# Patient Record
Sex: Female | Born: 1980 | Race: White | Hispanic: No | Marital: Single | Smoking: Never smoker
Health system: Southern US, Community
[De-identification: ages and names within clinical notes are randomized; demographics above are authoritative.]

## PROBLEM LIST (undated history)

## (undated) DIAGNOSIS — I10 Essential (primary) hypertension: Secondary | ICD-10-CM

## (undated) DIAGNOSIS — F419 Anxiety disorder, unspecified: Secondary | ICD-10-CM

## (undated) DIAGNOSIS — K219 Gastro-esophageal reflux disease without esophagitis: Secondary | ICD-10-CM

## (undated) DIAGNOSIS — G43909 Migraine, unspecified, not intractable, without status migrainosus: Secondary | ICD-10-CM

## (undated) DIAGNOSIS — IMO0001 Reserved for inherently not codable concepts without codable children: Secondary | ICD-10-CM

## (undated) DIAGNOSIS — E041 Nontoxic single thyroid nodule: Secondary | ICD-10-CM

## (undated) DIAGNOSIS — Z9109 Other allergy status, other than to drugs and biological substances: Secondary | ICD-10-CM

## (undated) DIAGNOSIS — I499 Cardiac arrhythmia, unspecified: Secondary | ICD-10-CM

## (undated) HISTORY — PX: BIOPSY THYROID: PRO38

## (undated) HISTORY — DX: Essential (primary) hypertension: I10

## (undated) HISTORY — PX: COLPOSCOPY: SHX161

## (undated) HISTORY — DX: Anxiety disorder, unspecified: F41.9

## (undated) HISTORY — DX: Other allergy status, other than to drugs and biological substances: Z91.09

## (undated) HISTORY — PX: WISDOM TOOTH EXTRACTION: SHX21

## (undated) HISTORY — DX: Migraine, unspecified, not intractable, without status migrainosus: G43.909

---

## 2014-06-05 DIAGNOSIS — R5383 Other fatigue: Secondary | ICD-10-CM | POA: Insufficient documentation

## 2014-06-05 DIAGNOSIS — R413 Other amnesia: Secondary | ICD-10-CM | POA: Insufficient documentation

## 2014-06-05 DIAGNOSIS — H539 Unspecified visual disturbance: Secondary | ICD-10-CM | POA: Insufficient documentation

## 2014-07-17 DIAGNOSIS — G4700 Insomnia, unspecified: Secondary | ICD-10-CM | POA: Insufficient documentation

## 2015-06-04 DIAGNOSIS — F411 Generalized anxiety disorder: Secondary | ICD-10-CM | POA: Insufficient documentation

## 2015-06-04 DIAGNOSIS — F4001 Agoraphobia with panic disorder: Secondary | ICD-10-CM | POA: Insufficient documentation

## 2016-02-15 ENCOUNTER — Encounter: Payer: Self-pay | Admitting: Obstetrics and Gynecology

## 2016-02-15 ENCOUNTER — Ambulatory Visit (INDEPENDENT_AMBULATORY_CARE_PROVIDER_SITE_OTHER): Payer: BLUE CROSS/BLUE SHIELD | Admitting: Obstetrics and Gynecology

## 2016-02-15 VITALS — BP 118/70 | HR 72 | Resp 14 | Ht 63.25 in | Wt 175.0 lb

## 2016-02-15 DIAGNOSIS — G43909 Migraine, unspecified, not intractable, without status migrainosus: Secondary | ICD-10-CM | POA: Insufficient documentation

## 2016-02-15 DIAGNOSIS — Z124 Encounter for screening for malignant neoplasm of cervix: Secondary | ICD-10-CM

## 2016-02-15 DIAGNOSIS — I10 Essential (primary) hypertension: Secondary | ICD-10-CM | POA: Diagnosis not present

## 2016-02-15 DIAGNOSIS — Z01419 Encounter for gynecological examination (general) (routine) without abnormal findings: Secondary | ICD-10-CM | POA: Diagnosis not present

## 2016-02-15 DIAGNOSIS — G43109 Migraine with aura, not intractable, without status migrainosus: Secondary | ICD-10-CM

## 2016-02-15 DIAGNOSIS — E049 Nontoxic goiter, unspecified: Secondary | ICD-10-CM

## 2016-02-15 MED ORDER — NORETHINDRONE 0.35 MG PO TABS: 1.0000 | ORAL_TABLET | Freq: Every day | ORAL | Status: DC

## 2016-02-15 NOTE — Patient Instructions (Signed)

## 2016-02-15 NOTE — Progress Notes (Addendum)
Patient ID: Hannah Hodge, female   DOB: [birth year], 35 y.o.   MRN: [id number] 35 y.o. [id number] SingleCaucasianF here for annual exam. She would like to discuss birth control. Not currently in a relationship. Period Cycle (Days): 84 Period Duration (Days): 3-4 days  Period Pattern: Regular Menstrual Flow: Moderate Menstrual Control: Maxi pad Dysmenorrhea: (!) Moderate Dysmenorrhea Symptoms: Cramping, Headache  She saturates a pad in 6-8 hours. She takes ibuprofen for her cramps. She has migraines with occasional aura's, start a week prior to her cycle and last for 2-2.5 weeks. She has seen a Neurologist who told her to go on the 3 month pills.   Patient's last menstrual period was 11/23/2015.          Sexually active: No.  The current method of family planning is OCP (estrogen/progesterone).    Exercising: No.  The patient does not participate in regular exercise at present. Smoker:  no  Health Maintenance: Pap:  2015 WNL per patient  History of abnormal Pap:  Yes colposcopy, in the last few years, no cervical surgery.  MMG:  N/A Colonoscopy:  N/A BMD:   N/A TDaP:  Unsure, she will check Gardasil: N/A   reports that she has never smoked. She has never used smokeless tobacco. She reports that she does not drink alcohol or use illicit drugs.She has full custody of her 35 year old daughter. Has family around, lives in [place]. She works in a TEFL teacher.   Past Medical History  Diagnosis Date  . Anxiety   . Hypertension   . Migraine     Past Surgical History  Procedure Laterality Date  . Cesarean section    . Colposcopy      Current Outpatient Prescriptions  Medication Sig Dispense Refill  . buPROPion (WELLBUTRIN XL) 150 MG 24 hr tablet   0  . busPIRone (BUSPAR) 15 MG tablet   0  . CAMRESE 0.15-0.03 &0.01 MG tablet   3  . cholecalciferol (VITAMIN D) 1000 units tablet Take 1,000 Units by mouth daily.    . metoprolol succinate (TOPROL-XL) 50 MG 24 hr tablet Take 50 mg by  mouth.    . montelukast (SINGULAIR) 10 MG tablet   2  . Multiple Vitamin (MULTIVITAMIN) tablet Take 1 tablet by mouth daily.    . rizatriptan (MAXALT) 10 MG tablet Take 10 mg by mouth.    . traZODone (DESYREL) 100 MG tablet      No current facility-administered medications for this visit.    Family History  Problem Relation Age of Onset  . Hypertension Mother   . Atrial fibrillation Mother   . Migraines Mother   . Migraines Sister   . Migraines Brother   . Dementia Maternal Grandmother   . Leukemia Maternal Grandfather   . Migraines Maternal Grandfather   MGM with CVA  Review of Systems  Constitutional: Negative.   Eyes: Negative.   Respiratory: Negative.   Cardiovascular: Negative.   Gastrointestinal: Negative.   Endocrine: Negative.   Genitourinary: Negative.   Musculoskeletal: Negative.   Skin: Negative.   Allergic/Immunologic: Negative.   Neurological: Positive for headaches.  Psychiatric/Behavioral: Negative.     Exam:   BP 118/70 mmHg  Pulse 72  Resp 14  Ht 5' 3.25" (1.607 m)  Wt 175 lb (79.379 kg)  BMI 30.74 kg/m2  LMP 11/23/2015  Weight change: @ Height:   Height: 5' 3.25" (160.7 cm)  Ht Readings from Last 3 Encounters:  02/15/16 5' 3.25" (1.607 m)    General  appearance: alert, cooperative and appears stated age Head: Normocephalic, without obvious abnormality, atraumatic Neck: no adenopathy, supple, symmetrical, trachea midline and thyroid with suspected nodule on the right side, <1 cm Lungs: clear to auscultation bilaterally Breasts: normal appearance, no masses or tenderness Heart: regular rate and rhythm Abdomen: soft, non-tender; bowel sounds normal; no masses,  no organomegaly Extremities: extremities normal, atraumatic, no cyanosis or edema Skin: Skin color, texture, turgor normal. No rashes or lesions Lymph nodes: Cervical, supraclavicular, and axillary nodes normal. No abnormal inguinal nodes palpated Neurologic: Grossly  normal   Pelvic: External genitalia:  no lesions              Urethra:  normal appearing urethra with no masses, tenderness or lesions              Bartholins and Skenes: normal                 Vagina: normal appearing vagina with normal color and discharge, no lesions              Cervix: no lesions               Bimanual Exam:  Uterus:  normal size, contour, position, consistency, mobility, non-tender, anteverted              Adnexa: no mass, fullness, tenderness               Rectovaginal: Confirms               Anus:  normal sphincter tone, no lesions  Chaperone was present for exam.  A:  Well Woman with normal exam  Migraines with aura  HTN  Suspected right thyroid nodule    P:   Pap with hpv  Labs and immunizations with primary MD  Recommend she go off OCP's, HTN is a relative contraindication, migraines with aura are an absolute contraindication to OCP's  Discussed options of the mini-pill, nexplanon and the mirena IUD  Will try the mini-pill   Calendar menses, track headaches  Discussed breast self exam  Discussed calcium and vit D  CC: Dr Anne Hahn, Neurology   Addendum: will set up a thyroid ultrasound

## 2016-02-18 NOTE — Addendum Note (Signed)
Addended by: Tobi Bastos on: 02/18/2016 03:43 PM   Modules accepted: Orders

## 2016-02-19 ENCOUNTER — Ambulatory Visit
Admission: RE | Admit: 2016-02-19 | Discharge: 2016-02-19 | Disposition: A | Discharge status: Home / Self Care | Payer: BLUE CROSS/BLUE SHIELD | Source: Ambulatory Visit | Attending: Obstetrics and Gynecology | Admitting: Obstetrics and Gynecology

## 2016-02-19 DIAGNOSIS — E049 Nontoxic goiter, unspecified: Secondary | ICD-10-CM

## 2016-02-22 ENCOUNTER — Telehealth: Payer: Self-pay | Admitting: Obstetrics and Gynecology

## 2016-02-22 DIAGNOSIS — E041 Nontoxic single thyroid nodule: Secondary | ICD-10-CM

## 2016-02-22 LAB — IPS PAP TEST WITH HPV

## 2016-02-22 NOTE — Telephone Encounter (Signed)
Spoke with patient. Advised I have reviewed her results from her thyroid ultrasound on 02/19/2016 with Dr.Jertson. Advised there is a 2.1 cm nodule within the right lobe of the thyroid which will need further evaluation and biopsy. She is agreeable and verbalizes understanding. Advised I will place a referral over to [facility]er [facility]. She is agreeable and requests an appointment Monday or Tuesday afternoons. Advised I will contact Dr.Althiemer's office to schedule and return call. She is agreeable.  Call to Dr.Althiemer's office. Per receptionist I will need to send over the referral and fax the patient ultrasound, demographics, and OV notes be faxed to [phone] attention Westly PamLacy for review with Dr.Althiemer for scheduling. Records faxed with cover sheet and confirmation to [phone].  Spoke with patient. Advised I have sent the referral over the Dr.Althiemer's office and she will be contacted directly by his office to schedule. She is agreeable and verbalizes understanding.  Dr.Jertson, Do you agree with the above recommendations?

## 2016-02-22 NOTE — Telephone Encounter (Signed)
Patient was seen for thyroid ultrasound with [facility] on 02/19/2016. Results are available in EPIC. Results also to Dr.Jertson's desk for review and advise.

## 2016-02-22 NOTE — Telephone Encounter (Signed)
Patient is calling to see if her thyroid ultrsasound results are in yet.

## 2016-02-22 NOTE — Telephone Encounter (Signed)
Yes, thank you.

## 2016-02-25 ENCOUNTER — Telehealth: Payer: Self-pay | Admitting: Emergency Medicine

## 2016-02-25 DIAGNOSIS — R87610 Atypical squamous cells of undetermined significance on cytologic smear of cervix (ASC-US): Secondary | ICD-10-CM

## 2016-02-25 DIAGNOSIS — R87810 Cervical high risk human papillomavirus (HPV) DNA test positive: Principal | ICD-10-CM

## 2016-02-25 NOTE — Telephone Encounter (Signed)
Message left to return call to Nickelsvilleracy at [phone].   Atypical squamous cells of undetermined significance (ASC-US). 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  Patient was to start on Micronor.

## 2016-02-25 NOTE — Telephone Encounter (Signed)
-----   Message from Romualdo BolkJill Evelyn Jertson, MD sent at 02/23/2016  2:35 PM EST ----- Please inform the patient that her pap is ASCUS with +HPV, she needs a colposcopy.

## 2016-02-25 NOTE — Telephone Encounter (Signed)
Patient notified of message from Dr Oscar LaJertson.  She is agreeable to scheduling colposcopy. Scheduled for 03/01/16 at 1300.  Brief description of procedure given to patient.  Colposcopy pre-procedure instructions given. Discussed menses and need to not have any bleeding on day of appointment, advised to call to reschedule if starts cycle. Patient on [place]. Make sure to eat a meal and hydrate before appointment.  Advised 800 mg of Motrin PO with food one hour prior to appointment.   Patient verbalized understanding of preprocedure instructions and  will call to reschedule if will be on menses or has any concerns regarding pregnancy.  Patient is advised she will be contacted with insurance coverage information. cc Harland DingwallSuzy Dixon  Routing to provider for final review. Patient agreeable to disposition. Will close encounter.

## 2016-03-01 ENCOUNTER — Encounter: Payer: Self-pay | Admitting: Obstetrics and Gynecology

## 2016-03-01 ENCOUNTER — Ambulatory Visit (INDEPENDENT_AMBULATORY_CARE_PROVIDER_SITE_OTHER): Payer: BLUE CROSS/BLUE SHIELD | Admitting: Obstetrics and Gynecology

## 2016-03-01 VITALS — BP 108/60 | HR 80 | Resp 14 | Wt 178.0 lb

## 2016-03-01 DIAGNOSIS — R87610 Atypical squamous cells of undetermined significance on cytologic smear of cervix (ASC-US): Secondary | ICD-10-CM

## 2016-03-01 DIAGNOSIS — R87810 Cervical high risk human papillomavirus (HPV) DNA test positive: Secondary | ICD-10-CM | POA: Diagnosis not present

## 2016-03-01 DIAGNOSIS — Z01812 Encounter for preprocedural laboratory examination: Secondary | ICD-10-CM

## 2016-03-01 NOTE — Progress Notes (Signed)
Patient ID: Hannah BillsCatherine Kotowski, female   DOB: [birth year], 35 y.o.   MRN: [id number] GYNECOLOGY  VISIT   HPI: 35 y.o.   Single  Caucasian  female   [phone] with Patient's last menstrual period was 11/23/2015.   here for  A colposcopy for an ASCUS/+HPV pap. She has had prior abnormal paps in the past, denies any h/o prior cervical surgery.  The patient recently diagnosed with a thyroid nodule. She has an appointment with an endocrinologist coming up. Not currently sexually active.   GYNECOLOGIC HISTORY: Patient's last menstrual period was 11/23/2015. Contraception:OCP Menopausal hormone therapy: none         OB History    Gravida Para Term Preterm AB TAB SAB Ectopic Multiple Living   3 1 1  2  2   1          Patient Active Problem List   Diagnosis Date Noted  . Migraine with aura and without status migrainosus, not intractable 02/15/2016  . Essential hypertension 02/15/2016    Past Medical History  Diagnosis Date  . Anxiety   . Hypertension   . Migraine     Past Surgical History  Procedure Laterality Date  . Cesarean section    . Colposcopy      Current Outpatient Prescriptions  Medication Sig Dispense Refill  . buPROPion (WELLBUTRIN XL) 150 MG 24 hr tablet   0  . busPIRone (BUSPAR) 15 MG tablet   0  . cholecalciferol (VITAMIN D) 1000 units tablet Take 1,000 Units by mouth daily.    . metoprolol succinate (TOPROL-XL) 50 MG 24 hr tablet Take 50 mg by mouth.    . montelukast (SINGULAIR) 10 MG tablet   2  . Multiple Vitamin (MULTIVITAMIN) tablet Take 1 tablet by mouth daily.    . norethindrone (MICRONOR,CAMILA,ERRIN) 0.35 MG tablet Take 1 tablet (0.35 mg total) by mouth daily. 3 Package 0  . rizatriptan (MAXALT) 10 MG tablet Take 10 mg by mouth.    . traZODone (DESYREL) 100 MG tablet      No current facility-administered medications for this visit.     ALLERGIES: Review of patient's allergies indicates no known allergies.  Family History  Problem Relation Age of Onset   . Hypertension Mother   . Atrial fibrillation Mother   . Migraines Mother   . Migraines Sister   . Migraines Brother   . Dementia Maternal Grandmother   . Leukemia Maternal Grandfather   . Migraines Maternal Grandfather     Social History   Social History  . Marital Status: Single    Spouse Name: N/A  . Number of Children: N/A  . Years of Education: N/A   Occupational History  . Not on file.   Social History Main Topics  . Smoking status: Never Smoker   . Smokeless tobacco: Never Used  . Alcohol Use: No  . Drug Use: No  . Sexual Activity:    Partners: Male   Other Topics Concern  . Not on file   Social History Narrative    Review of Systems  Constitutional: Negative.   HENT: Negative.   Eyes: Negative.   Respiratory: Negative.   Cardiovascular: Negative.   Gastrointestinal: Negative.   Genitourinary: Negative.   Musculoskeletal: Negative.   Skin: Negative.   Neurological: Negative.   Endo/Heme/Allergies: Negative.   Psychiatric/Behavioral: Negative.     PHYSICAL EXAMINATION:    BP 108/60 mmHg  Pulse 80  Resp 14  Wt 178 lb (80.74 kg)  LMP 11/23/2015  General appearance: alert, cooperative and appears stated age  Pelvic: External genitalia:  no lesions              Urethra:  normal appearing urethra with no masses, tenderness or lesions              Bartholins and Skenes: normal                 Vagina: normal appearing vagina with normal color and discharge, no lesions              Cervix: no lesions                Colposcopy unsatisfactory. Mild aceto-white changes at 12 o'clock, biopsy taken.  Lugols exam of the vagina revealed a few small areas on the left vaginal side wall with decreased lugols uptake. Biopsy taken at 3 o'clock ECC done Biopsy sites treated with silver nitrate.  Chaperone was present for exam.  ASSESSMENT Abnormal pap, ASCUS/+HPV   PLAN Colposcopy with cervical, vaginal biopsies and ECC Further plan based on  results  An After Visit Summary was printed and given to the patient.

## 2016-03-01 NOTE — Patient Instructions (Signed)

## 2016-03-10 NOTE — Telephone Encounter (Signed)
-----   Message from Romualdo BolkJill Evelyn Jertson, MD sent at 03/04/2016  8:55 AM EDT ----- Please inform the patient that her cervical biopsy returned with CIN 1, which we follow, her ECC was negative. 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 Please have her make an appointment so we can discuss treatment options.

## 2016-03-10 NOTE — Telephone Encounter (Signed)
Patient returning call.

## 2016-03-10 NOTE — Telephone Encounter (Signed)
Call to patient. She is given result notes from Dr. Lamonte SakaiJertsonl. She verbalizes understanding of results and questions answered. Patient is scheduled for consult with Dr. Oscar LaJertson 03/15/16 at 1600.   Routing to provider for final review. Patient agreeable to disposition. Will close encounter.

## 2016-03-10 NOTE — Telephone Encounter (Signed)
Message left to return call to Hannah Hodge at [phone].    

## 2016-03-15 ENCOUNTER — Encounter: Payer: Self-pay | Admitting: Obstetrics and Gynecology

## 2016-03-15 ENCOUNTER — Ambulatory Visit (INDEPENDENT_AMBULATORY_CARE_PROVIDER_SITE_OTHER): Payer: BLUE CROSS/BLUE SHIELD | Admitting: Obstetrics and Gynecology

## 2016-03-15 VITALS — BP 110/70 | HR 72 | Resp 16 | Wt 176.0 lb

## 2016-03-15 DIAGNOSIS — N870 Mild cervical dysplasia: Secondary | ICD-10-CM | POA: Diagnosis not present

## 2016-03-15 DIAGNOSIS — N891 Moderate vaginal dysplasia: Secondary | ICD-10-CM

## 2016-03-15 NOTE — Progress Notes (Signed)
Patient ID: Hannah Hodge, female   DOB: 01/10/1981, 35 y.o.   MRN: [id number] GYNECOLOGY  VISIT   HPI: 35 y.o.   Single  Caucasian  female   [phone] with Patient's last menstrual period was 11/23/2015.   here for consult to discuss VAIN treatment. The patient had an ASCUS/+HPV pap. Colposcopy was unsatisfactory, cervical biopsy returned with CIN 1, negative ECC, vaginal biopsy with VAIN II.  GYNECOLOGIC HISTORY: Patient's last menstrual period was 11/23/2015. Contraception:OCP Menopausal hormone therapy: none         OB History    Gravida Para Term Preterm AB TAB SAB Ectopic Multiple Living   Patient Active Problem List   Diagnosis Date Noted  . Migraine with aura and without status migrainosus, not intractable 02/15/2016  . Essential hypertension 02/15/2016    Past Medical History  Diagnosis Date  . Anxiety   . Hypertension   . Migraine     Past Surgical History  Procedure Laterality Date  . Cesarean section    . Colposcopy      Current Outpatient Prescriptions  Medication Sig Dispense Refill  . buPROPion (WELLBUTRIN XL) 150 MG 24 hr tablet   0  . busPIRone (BUSPAR) 15 MG tablet   0  . cholecalciferol (VITAMIN D) 1000 units tablet Take 1,000 Units by mouth daily.    . metoprolol succinate (TOPROL-XL) 50 MG 24 hr tablet Take 50 mg by mouth.    . montelukast (SINGULAIR) 10 MG tablet   2  . Multiple Vitamin (MULTIVITAMIN) tablet Take 1 tablet by mouth daily.    . norethindrone (MICRONOR,CAMILA,ERRIN) 0.35 MG tablet Take 1 tablet (0.35 mg total) by mouth daily. 3 Package 0  . rizatriptan (MAXALT) 10 MG tablet Take 10 mg by mouth.    . traZODone (DESYREL) 100 MG tablet      No current facility-administered medications for this visit.     ALLERGIES: Review of patient's allergies indicates no known allergies.  Family History  Problem Relation Age of Onset  . Hypertension Mother   . Atrial fibrillation Mother   . Migraines Mother   .  Migraines Sister   . Migraines Brother   . Dementia Maternal Grandmother   . Leukemia Maternal Grandfather   . Migraines Maternal Grandfather     Social History   Social History  . Marital Status: Single    Spouse Name: N/A  . Number of Children: N/A  . Years of Education: N/A   Occupational History  . Not on file.   Social History Main Topics  . Smoking status: Never Smoker   . Smokeless tobacco: Never Used  . Alcohol Use: No  . Drug Use: No  . Sexual Activity:    Partners: Male   Other Topics Concern  . Not on file   Social History Narrative    Review of Systems  Constitutional: Negative.   HENT: Negative.   Eyes: Negative.   Respiratory: Negative.   Cardiovascular: Negative.   Gastrointestinal: Negative.   Genitourinary: Negative.   Musculoskeletal: Negative.   Skin: Negative.   Neurological: Negative.   Endo/Heme/Allergies: Negative.   Psychiatric/Behavioral: Negative.     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 Discussed the risks and benefits of each Explained that excision is the gold standard, there are a small amount of VAIN specimens that have microinvasive canceer Discussed risks of vaginal scarring, dyspareunia Discussed the risk of vaginal ulcerations from 5FU and needing to stop treatment.  Discussed the need for close f/u She would like to schedule removal of vain, possible ablation in the OR   An After Visit Summary was printed and given to the patient.  15 minutes face to face time of which over 50% was spent in counseling.

## 2016-03-17 NOTE — Telephone Encounter (Signed)
Patient is scheduled to see Dr.Altheimer on 03/15/2016. She was notified of this appointment date and time by Dr.Altheimer's office.  Routing to provider for final review. Patient agreeable to disposition. Will close encounter.

## 2016-03-18 ENCOUNTER — Other Ambulatory Visit: Payer: Self-pay | Admitting: Endocrinology

## 2016-03-18 DIAGNOSIS — E041 Nontoxic single thyroid nodule: Secondary | ICD-10-CM

## 2016-03-21 ENCOUNTER — Telehealth: Payer: Self-pay | Admitting: Obstetrics and Gynecology

## 2016-03-21 NOTE — Telephone Encounter (Signed)
Called patient to review benefits for a recommended procedure. Left Voicemail requesting a call back. °

## 2016-03-22 ENCOUNTER — Ambulatory Visit
Admission: RE | Admit: 2016-03-22 | Discharge: 2016-03-22 | Disposition: A | Discharge status: Home / Self Care | Payer: BLUE CROSS/BLUE SHIELD | Source: Ambulatory Visit | Attending: Endocrinology | Admitting: Endocrinology

## 2016-03-22 ENCOUNTER — Other Ambulatory Visit (HOSPITAL_COMMUNITY)
Admission: RE | Admit: 2016-03-22 | Discharge: 2016-03-22 | Disposition: A | Discharge status: Home / Self Care | Payer: BLUE CROSS/BLUE SHIELD | Source: Ambulatory Visit | Attending: Radiology | Admitting: Radiology

## 2016-03-22 DIAGNOSIS — E041 Nontoxic single thyroid nodule: Secondary | ICD-10-CM | POA: Diagnosis present

## 2016-03-22 NOTE — Telephone Encounter (Signed)
Return call to Suzy. °

## 2016-03-22 NOTE — Telephone Encounter (Signed)
Returned patients call.Call patient to discuss benefits for a procedure. Left Voicemail requesting a call.

## 2016-03-22 NOTE — Telephone Encounter (Signed)
Called patient to review benefits for a recommended procedure. Left Voicemail requesting a call back. °

## 2016-03-24 NOTE — Telephone Encounter (Signed)
Called patient to review benefits for a recommended procedure. Left Voicemail requesting a call back. °

## 2016-03-25 NOTE — Telephone Encounter (Signed)
Patient returned call. Reviewed benefits. Patient understood but not ready to schedule at this time. Patient prefers to call pre-service center for information prior to scheduling. Patient agreeable to return call from our office next week to check status.

## 2016-03-25 NOTE — Telephone Encounter (Signed)
Patient returned your call.

## 2016-03-25 NOTE — Telephone Encounter (Signed)
Left patient a very detailed message, advising I'm available today until 12:30 and after 1:30 today [facility]. Upon return call we will convey benefits for requested procedure

## 2016-04-06 NOTE — Telephone Encounter (Signed)
Called patient to follow up regarding recommended surgical procedure.  Left message on voicemail to [facility]

## 2016-04-11 NOTE — Telephone Encounter (Signed)
Called patient to review benefits for a recommended procedure. Left Voicemail requesting a call back. °

## 2016-04-19 ENCOUNTER — Telehealth: Payer: Self-pay | Admitting: *Deleted

## 2016-04-19 NOTE — Telephone Encounter (Signed)
See previous phone encounter with multiple attempts from business office to reach patient regarding surgery scheduling. Call to patient, left message to call back. Per ROI, can leave detailed message. Left message this is nursing supervisor calling for update regarding scheduling. Please call back with update.

## 2016-05-17 ENCOUNTER — Ambulatory Visit: Payer: BLUE CROSS/BLUE SHIELD | Admitting: Obstetrics and Gynecology

## 2016-05-17 ENCOUNTER — Telehealth: Payer: Self-pay | Admitting: Obstetrics and Gynecology

## 2016-05-17 NOTE — Telephone Encounter (Signed)
Patient canceled her 3 month recheck appointment due to lack of child care. Patient rescheduled to 05/26/16 at 2:30pm. Patient is aware of the cancel without 24 hours notice policy and that we will not be able to waive dnka fee again.

## 2016-05-26 ENCOUNTER — Ambulatory Visit: Payer: BLUE CROSS/BLUE SHIELD | Admitting: Obstetrics and Gynecology

## 2016-05-26 ENCOUNTER — Ambulatory Visit (INDEPENDENT_AMBULATORY_CARE_PROVIDER_SITE_OTHER): Payer: BLUE CROSS/BLUE SHIELD | Admitting: Obstetrics and Gynecology

## 2016-05-26 ENCOUNTER — Encounter: Payer: Self-pay | Admitting: Obstetrics and Gynecology

## 2016-05-26 VITALS — BP 110/70 | HR 64 | Resp 14 | Wt 180.0 lb

## 2016-05-26 DIAGNOSIS — Z3041 Encounter for surveillance of contraceptive pills: Secondary | ICD-10-CM

## 2016-05-26 DIAGNOSIS — N891 Moderate vaginal dysplasia: Secondary | ICD-10-CM

## 2016-05-26 DIAGNOSIS — R519 Headache, unspecified: Secondary | ICD-10-CM

## 2016-05-26 DIAGNOSIS — R51 Headache: Secondary | ICD-10-CM

## 2016-05-26 MED ORDER — NORETHINDRONE 0.35 MG PO TABS: 1.0000 | ORAL_TABLET | Freq: Every day | ORAL | Status: DC

## 2016-05-26 NOTE — Progress Notes (Signed)
Patient ID: Hannah Hodge, female   DOB: Nov 22, 1981, 35 y.o.   MRN: [id number] GYNECOLOGY  VISIT   HPI: 35 y.o.   Single  Caucasian  female   [id number] with No LMP recorded. Patient is not currently having periods (Reason: Oral contraceptives).   here for follow up OCP. She is having more headaches. She was taken off of OCP's in 2/17 secondary to HTN and migraines with aura. She hasn't had regular cycles on the micronor, just intermittent spotting and cramping (about 1 x a month). She has noticed an increased frequency in her headaches, not more severe. Prior to the minipill, she would only get headaches around her cycle. It would last for a couple of days around her cycle, more severe. Now she is getting a headache every few days, mild to moderate, no severe headaches. She has a neurolgist.  She was diagnosed with VAIN II, she has been planing on surgery, still figuring it out.  She has issues with depression and anxiety, seeing a psychiatrist.   GYNECOLOGIC HISTORY: No LMP recorded. Patient is not currently having periods (Reason: Oral contraceptives). Contraception:OCP Menopausal hormone therapy: none         OB History    Gravida Para Term Preterm AB TAB SAB Ectopic Multiple Living   3 1 1  2  2   1          Patient Active Problem List   Diagnosis Date Noted  . Migraine with aura and without status migrainosus, not intractable 02/15/2016  . Essential hypertension 02/15/2016    Past Medical History  Diagnosis Date  . Anxiety   . Hypertension   . Migraine     Past Surgical History  Procedure Laterality Date  . Cesarean section    . Colposcopy      Current Outpatient Prescriptions  Medication Sig Dispense Refill  . buPROPion (WELLBUTRIN XL) 150 MG 24 hr tablet   0  . busPIRone (BUSPAR) 15 MG tablet   0  . cholecalciferol (VITAMIN D) 1000 units tablet Take 1,000 Units by mouth daily.    . metoprolol succinate (TOPROL-XL) 50 MG 24 hr tablet Take 50 mg by mouth.    .  montelukast (SINGULAIR) 10 MG tablet   2  . Multiple Vitamin (MULTIVITAMIN) tablet Take 1 tablet by mouth daily.    . norethindrone (MICRONOR,CAMILA,ERRIN) 0.35 MG tablet Take 1 tablet (0.35 mg total) by mouth daily. 3 Package 0  . rizatriptan (MAXALT) 10 MG tablet Take 10 mg by mouth.    . traZODone (DESYREL) 100 MG tablet      No current facility-administered medications for this visit.     ALLERGIES: Review of patient's allergies indicates no known allergies.  Family History  Problem Relation Age of Onset  . Hypertension Mother   . Atrial fibrillation Mother   . Migraines Mother   . Migraines Sister   . Migraines Brother   . Dementia Maternal Grandmother   . Leukemia Maternal Grandfather   . Migraines Maternal Grandfather     Social History   Social History  . Marital Status: Single    Spouse Name: N/A  . Number of Children: N/A  . Years of Education: N/A   Occupational History  . Not on file.   Social History Main Topics  . Smoking status: Never Smoker   . Smokeless tobacco: Never Used  . Alcohol Use: No  . Drug Use: No  . Sexual Activity:    Partners: Male   Other Topics  Concern  . Not on file   Social History Narrative    Review of Systems  Constitutional: Negative.        Weight gain  Eyes: Negative.   Respiratory: Negative.   Cardiovascular: Negative.   Gastrointestinal: Negative.   Genitourinary: Negative.   Musculoskeletal: Positive for myalgias.  Skin: Negative.   Neurological: Positive for headaches.  Endo/Heme/Allergies: Negative.   Psychiatric/Behavioral: The patient is nervous/anxious.     PHYSICAL EXAMINATION:    BP 110/70 mmHg  Pulse 64  Resp 14  Wt 180 lb (81.647 kg)    General appearance: alert, cooperative and appears stated age  ASSESSMENT Contraception, changed to micronor in 2/17. More frequent headaches, but less severe Discussed the option of IUD's and she declines, would prefer to stay on the micronor H/O VAIN II, we  were planning on surgical removal. Discussed the option of 5FU More frequent headaches    PLAN Continue micronor Stressed that she needs to treat the VAIN, surgery or 5FU if she prefers. Discussed the risks. F/U with neurology  Patient is meeting with the nurse manager to try and set up her surgery.   An After Visit Summary was printed and given to the patient.  15 minutes face to face time of which over 50% was spent in counseling.    CC: Neurologist (she will call back with the name)

## 2016-05-30 NOTE — Patient Instructions (Addendum)
Your procedure is scheduled on:  Thursday, June 02, 2016  [facility] at:  6:00 AM  Pick up the phone at the desk and dial [phone].  Call this number if you have problems the morning of surgery: [phone].  Remember: Do NOT eat food or drink after:  Midnight Wednesday  Take these medicines the morning of surgery with a SIP OF WATER:  Bupropion  Do NOT wear jewelry (body piercing), metal hair clips/bobby pins, make-up, or nail polish. Do NOT wear lotions, powders, or perfumes.  You may wear deodorant. Do NOT shave for 48 hours prior to surgery. Do NOT bring valuables to the hospital. Contacts, dentures, or bridgework may not be worn into surgery.  Have a responsible adult drive you home and stay with you for 24 hours after your procedure

## 2016-05-31 ENCOUNTER — Encounter (INDEPENDENT_AMBULATORY_CARE_PROVIDER_SITE_OTHER): Payer: Self-pay

## 2016-05-31 ENCOUNTER — Encounter (HOSPITAL_COMMUNITY)
Admission: RE | Admit: 2016-05-31 | Discharge: 2016-05-31 | Disposition: A | Discharge status: Home / Self Care | Payer: BLUE CROSS/BLUE SHIELD | Source: Ambulatory Visit | Attending: Obstetrics and Gynecology | Admitting: Obstetrics and Gynecology

## 2016-05-31 ENCOUNTER — Encounter (HOSPITAL_COMMUNITY): Payer: Self-pay

## 2016-05-31 ENCOUNTER — Ambulatory Visit (INDEPENDENT_AMBULATORY_CARE_PROVIDER_SITE_OTHER): Payer: BLUE CROSS/BLUE SHIELD | Admitting: Obstetrics and Gynecology

## 2016-05-31 ENCOUNTER — Encounter: Payer: Self-pay | Admitting: Obstetrics and Gynecology

## 2016-05-31 VITALS — BP 100/60 | HR 64 | Resp 14 | Wt 179.0 lb

## 2016-05-31 DIAGNOSIS — Z9109 Other allergy status, other than to drugs and biological substances: Secondary | ICD-10-CM | POA: Insufficient documentation

## 2016-05-31 DIAGNOSIS — Z79899 Other long term (current) drug therapy: Secondary | ICD-10-CM | POA: Diagnosis not present

## 2016-05-31 DIAGNOSIS — F419 Anxiety disorder, unspecified: Secondary | ICD-10-CM | POA: Diagnosis not present

## 2016-05-31 DIAGNOSIS — I10 Essential (primary) hypertension: Secondary | ICD-10-CM | POA: Diagnosis not present

## 2016-05-31 DIAGNOSIS — N891 Moderate vaginal dysplasia: Secondary | ICD-10-CM | POA: Diagnosis not present

## 2016-05-31 DIAGNOSIS — I499 Cardiac arrhythmia, unspecified: Secondary | ICD-10-CM | POA: Diagnosis not present

## 2016-05-31 DIAGNOSIS — K219 Gastro-esophageal reflux disease without esophagitis: Secondary | ICD-10-CM | POA: Diagnosis not present

## 2016-05-31 HISTORY — DX: Gastro-esophageal reflux disease without esophagitis: K21.9

## 2016-05-31 HISTORY — DX: Cardiac arrhythmia, unspecified: I49.9

## 2016-05-31 HISTORY — DX: Reserved for inherently not codable concepts without codable children: IMO0001

## 2016-05-31 HISTORY — DX: Nontoxic single thyroid nodule: E04.1

## 2016-05-31 LAB — CBC
HCT: 41.3 % (ref 36.0–46.0)
Hemoglobin: 13.6 g/dL (ref 12.0–15.0)
MCH: 27.8 pg (ref 26.0–34.0)
MCHC: 32.9 g/dL (ref 30.0–36.0)
MCV: 84.5 fL (ref 78.0–100.0)
PLATELETS: 251 10*3/uL (ref 150–400)
RBC: 4.89 MIL/uL (ref 3.87–5.11)
RDW: 13 % (ref 11.5–15.5)
WBC: 7.3 10*3/uL (ref 4.0–10.5)

## 2016-05-31 LAB — BASIC METABOLIC PANEL
Anion gap: 5 (ref 5–15)
BUN: 13 mg/dL (ref 6–20)
CO2: 27 mmol/L (ref 22–32)
CREATININE: 1.02 mg/dL — AB (ref 0.44–1.00)
Calcium: 9.1 mg/dL (ref 8.9–10.3)
Chloride: 105 mmol/L (ref 101–111)
GFR calc Af Amer: >60 mL/min (ref >60)
GLUCOSE: 103 mg/dL — AB (ref 65–99)
Potassium: 4.3 mmol/L (ref 3.5–5.1)
SODIUM: 137 mmol/L (ref 135–145)

## 2016-05-31 NOTE — Progress Notes (Signed)
Patient ID: Hannah Hodge, female   DOB: 10/01/1981, 35 y.o.   MRN: [id number] GYNECOLOGY  VISIT   HPI: 35 y.o.   Single  Caucasian  female   [phone] with Patient's last menstrual period was 05/27/2016 (approximate).   here for surgery consult. She has VAIN II. The patient had an ASCUS/+HPV pap at the end of 2/17. Colposcopy in 3/17 was unsatisfactory, cervical biopsy returned with CIN 1, negative ECC, vaginal biopsy with VAIN II. She is not currently sexually active.   GYNECOLOGIC HISTORY: Patient's last menstrual period was 05/27/2016 (approximate). Contraception:OCP Menopausal hormone therapy: none        OB History    Gravida Para Term Preterm AB TAB SAB Ectopic Multiple Living   Patient Active Problem List   Diagnosis Date Noted  . Migraine with aura and without status migrainosus, not intractable 02/15/2016  . Essential hypertension 02/15/2016    Past Medical History  Diagnosis Date  . Anxiety   . Hypertension   . Migraine   . Dysrhythmia     tachycardia with anxiety  . Shortness of breath dyspnea     with anxiety  . Thyroid nodule     benign  . GERD (gastroesophageal reflux disease)     Past Surgical History  Procedure Laterality Date  . Cesarean section    . Colposcopy    . Biopsy thyroid      Current Outpatient Prescriptions  Medication Sig Dispense Refill  . buPROPion (WELLBUTRIN XL) 150 MG 24 hr tablet Take 150 mg by mouth daily.   0  . busPIRone (BUSPAR) 15 MG tablet Take 15 mg by mouth 2 (two) times daily as needed (anxiety).   0  . cholecalciferol (VITAMIN D) 1000 units tablet Take 1,000 Units by mouth daily.    Marland Kitchen ibuprofen (ADVIL,MOTRIN) 200 MG tablet Take 600 mg by mouth every 6 (six) hours as needed for headache or moderate pain.    . metoprolol (LOPRESSOR) 50 MG tablet Take 50 mg by mouth daily.    . montelukast (SINGULAIR) 10 MG tablet Take 10 mg by mouth at bedtime.   2  . Multiple Vitamin (MULTIVITAMIN) tablet Take  1 tablet by mouth daily.    . norethindrone (MICRONOR,CAMILA,ERRIN) 0.35 MG tablet Take 1 tablet (0.35 mg total) by mouth daily. 3 Package 2  . rizatriptan (MAXALT) 10 MG tablet Take 10 mg by mouth as needed for migraine.     . traZODone (DESYREL) 100 MG tablet Take 100-200 mg by mouth at bedtime. sleep     No current facility-administered medications for this visit.     ALLERGIES: Review of patient's allergies indicates no known allergies.  Family History  Problem Relation Age of Onset  . Hypertension Mother   . Atrial fibrillation Mother   . Migraines Mother   . Migraines Sister   . Migraines Brother   . Dementia Maternal Grandmother   . Leukemia Maternal Grandfather   . Migraines Maternal Grandfather     Social History   Social History  . Marital Status: Single    Spouse Name: N/A  . Number of Children: N/A  . Years of Education: N/A   Occupational History  . Not on file.   Social History Main Topics  . Smoking status: Never Smoker   . Smokeless tobacco: Never Used  . Alcohol Use: No  . Drug Use: No  . Sexual Activity:  Partners: Male    Birth Control/ Protection: Pill   Other Topics Concern  . Not on file   Social History Narrative    Review of Systems  Constitutional: Negative.   HENT: Negative.   Eyes: Negative.   Respiratory: Negative.   Cardiovascular: Negative.   Gastrointestinal: Negative.   Genitourinary: Negative.   Musculoskeletal: Negative.   Skin: Negative.   Neurological: Negative.   Endo/Heme/Allergies: Negative.   Psychiatric/Behavioral: Negative.     PHYSICAL EXAMINATION:    BP 100/60 mmHg  Pulse 64  Resp 14  Wt 179 lb (81.194 kg)  LMP 05/27/2016 (Approximate)    General appearance: alert, cooperative and appears stated age Neck: no adenopathy, supple, symmetrical, trachea midline and thyroid normal to inspection and palpation Heart: regular rate and rhythm Lungs: CTAB Abdomen: soft, non-tender; bowel sounds normal; no  masses,  no organomegaly Lungs: CTAB Extremities: normal, atraumatic, no cyanosis Skin: normal color, texture and turgor, no rashes or lesions Lymph: normal cervical supraclavicular and inguinal nodes Neurologic: grossly normal   ASSESSMENT VAIN II    PLAN Removal/j plasma treatment of vaginal dysplasia Discussed risks of infection, bleeding, abnormal vaginal discharge, vaginal scarring, and dyspareunia   An After Visit Summary was printed and given to the patient.

## 2016-05-31 NOTE — H&P (Signed)
GYNECOLOGY  VISIT   HPI: 35 y.o.   Single  Caucasian  female   [phone] with Patient's last menstrual period was 05/27/2016 (approximate).   here for surgery consult. She has VAIN II. The patient had an ASCUS/+HPV pap at the end of 2/17. Colposcopy in 3/17 was unsatisfactory, cervical biopsy returned with CIN 1, negative ECC, vaginal biopsy with VAIN II. She is not currently sexually active.   GYNECOLOGIC HISTORY: Patient's last menstrual period was 05/27/2016 (approximate). Contraception:OCP Menopausal hormone therapy: none        OB History    Gravida Para Term Preterm AB TAB SAB Ectopic Multiple Living   Patient Active Problem List   Diagnosis Date Noted  . Migraine with aura and without status migrainosus, not intractable 02/15/2016  . Essential hypertension 02/15/2016    Past Medical History  Diagnosis Date  . Anxiety   . Hypertension   . Migraine   . Dysrhythmia     tachycardia with anxiety  . Shortness of breath dyspnea     with anxiety  . Thyroid nodule     benign  . GERD (gastroesophageal reflux disease)     Past Surgical History  Procedure Laterality Date  . Cesarean section    . Colposcopy    . Biopsy thyroid      Current Outpatient Prescriptions  Medication Sig Dispense Refill  . buPROPion (WELLBUTRIN XL) 150 MG 24 hr tablet Take 150 mg by mouth daily.   0  . busPIRone (BUSPAR) 15 MG tablet Take 15 mg by mouth 2 (two) times daily as needed (anxiety).   0  . cholecalciferol (VITAMIN D) 1000 units tablet Take 1,000 Units by mouth daily.    Marland Kitchen ibuprofen (ADVIL,MOTRIN) 200 MG tablet Take 600 mg by mouth every 6 (six) hours as needed for headache or moderate pain.    . metoprolol (LOPRESSOR) 50 MG tablet Take 50 mg by mouth daily.    . montelukast (SINGULAIR) 10 MG tablet Take 10 mg by mouth at bedtime.   2  . Multiple Vitamin (MULTIVITAMIN) tablet Take 1 tablet by mouth daily.    . norethindrone (MICRONOR,CAMILA,ERRIN) 0.35 MG  tablet Take 1 tablet (0.35 mg total) by mouth daily. 3 Package 2  . rizatriptan (MAXALT) 10 MG tablet Take 10 mg by mouth as needed for migraine.     . traZODone (DESYREL) 100 MG tablet Take 100-200 mg by mouth at bedtime. sleep     No current facility-administered medications for this visit.     ALLERGIES: Review of patient's allergies indicates no known allergies.  Family History  Problem Relation Age of Onset  . Hypertension Mother   . Atrial fibrillation Mother   . Migraines Mother   . Migraines Sister   . Migraines Brother   . Dementia Maternal Grandmother   . Leukemia Maternal Grandfather   . Migraines Maternal Grandfather     Social History   Social History  . Marital Status: Single    Spouse Name: N/A  . Number of Children: N/A  . Years of Education: N/A   Occupational History  . Not on file.   Social History Main Topics  . Smoking status: Never Smoker   . Smokeless tobacco: Never Used  . Alcohol Use: No  . Drug Use: No  . Sexual Activity:    Partners: Male    Birth Control/ Protection: Pill   Other  Topics Concern  . Not on file   Social History Narrative    Review of Systems  Constitutional: Negative.   HENT: Negative.   Eyes: Negative.   Respiratory: Negative.   Cardiovascular: Negative.   Gastrointestinal: Negative.   Genitourinary: Negative.   Musculoskeletal: Negative.   Skin: Negative.   Neurological: Negative.   Endo/Heme/Allergies: Negative.   Psychiatric/Behavioral: Negative.     PHYSICAL EXAMINATION:    BP 100/60 mmHg  Pulse 64  Resp 14  Wt 179 lb (81.194 kg)  LMP 05/27/2016 (Approximate)    General appearance: alert, cooperative and appears stated age Neck: no adenopathy, supple, symmetrical, trachea midline and thyroid normal to inspection and palpation Heart: regular rate and rhythm Lungs: CTAB Abdomen: soft, non-tender; bowel sounds normal; no masses,  no organomegaly Lungs: CTAB Extremities: normal, atraumatic, no  cyanosis Skin: normal color, texture and turgor, no rashes or lesions Lymph: normal cervical supraclavicular and inguinal nodes Neurologic: grossly normal   ASSESSMENT VAIN II    PLAN Removal/j plasma treatment of vaginal dysplasia Discussed risks of infection, bleeding, abnormal vaginal discharge, vaginal scarring, and dyspareunia   An After Visit Summary was printed and given to the patient.

## 2016-06-01 MED ORDER — KCL IN DEXTROSE-NACL 20-5-0.45 MEQ/L-%-% IV SOLN
INTRAVENOUS | Status: DC
  Filled 2016-06-01 (×5): qty 1000

## 2016-06-01 NOTE — Anesthesia Preprocedure Evaluation (Addendum)
Anesthesia Evaluation  Patient identified by MRN, date of birth, ID band Patient awake    Reviewed: Allergy & Precautions, NPO status , Patient's Chart, lab work & pertinent test results, reviewed documented beta blocker date and time   Airway Mallampati: II  TM Distance: >3 FB Neck ROM: Full    Dental  (+) Teeth Intact   Pulmonary    breath sounds clear to auscultation       Cardiovascular hypertension, Pt. on home beta blockers + dysrhythmias  Rhythm:Regular Rate:Normal     Neuro/Psych  Headaches, PSYCHIATRIC DISORDERS Anxiety    GI/Hepatic Neg liver ROS, GERD  ,  Endo/Other  negative endocrine ROS  Renal/GU negative Renal ROS  negative genitourinary   Musculoskeletal negative musculoskeletal ROS (+)   Abdominal   Peds negative pediatric ROS (+)  Hematology negative hematology ROS (+)   Anesthesia Other Findings   Reproductive/Obstetrics negative OB ROS                            Lab Results  Component Value Date   WBC 7.3 05/31/2016   HGB 13.6 05/31/2016   HCT 41.3 05/31/2016   MCV 84.5 05/31/2016   PLT 251 05/31/2016   Lab Results  Component Value Date   CREATININE 1.02* 05/31/2016   BUN 13 05/31/2016   NA 137 05/31/2016   K 4.3 05/31/2016   CL 105 05/31/2016   CO2 27 05/31/2016   No results found for: INR, PROTIME   Anesthesia Physical Anesthesia Plan  ASA: II  Anesthesia Plan: General   Post-op Pain Management:    Induction: Intravenous  Airway Management Planned: LMA  Additional Equipment:   Intra-op Plan:   Post-operative Plan: Extubation in OR  Informed Consent: I have reviewed the patients History and Physical, chart, labs and discussed the procedure including the risks, benefits and alternatives for the proposed anesthesia with the patient or authorized representative who has indicated his/her understanding and acceptance.     Plan Discussed with:  CRNA  Anesthesia Plan Comments:         Anesthesia Quick Evaluation

## 2016-06-02 ENCOUNTER — Ambulatory Visit (HOSPITAL_COMMUNITY)
Admission: RE | Admit: 2016-06-02 | Discharge: 2016-06-02 | Disposition: A | Discharge status: Home / Self Care | Payer: BLUE CROSS/BLUE SHIELD | Source: Ambulatory Visit | Attending: Obstetrics and Gynecology | Admitting: Obstetrics and Gynecology

## 2016-06-02 ENCOUNTER — Ambulatory Visit (HOSPITAL_COMMUNITY): Payer: BLUE CROSS/BLUE SHIELD | Admitting: Anesthesiology

## 2016-06-02 ENCOUNTER — Encounter (HOSPITAL_COMMUNITY)
Admission: RE | Disposition: A | Discharge status: Home / Self Care | Payer: Self-pay | Source: Ambulatory Visit | Attending: Obstetrics and Gynecology

## 2016-06-02 DIAGNOSIS — Z79899 Other long term (current) drug therapy: Secondary | ICD-10-CM | POA: Insufficient documentation

## 2016-06-02 DIAGNOSIS — I10 Essential (primary) hypertension: Secondary | ICD-10-CM | POA: Insufficient documentation

## 2016-06-02 DIAGNOSIS — I499 Cardiac arrhythmia, unspecified: Secondary | ICD-10-CM | POA: Insufficient documentation

## 2016-06-02 DIAGNOSIS — F419 Anxiety disorder, unspecified: Secondary | ICD-10-CM | POA: Insufficient documentation

## 2016-06-02 DIAGNOSIS — N891 Moderate vaginal dysplasia: Secondary | ICD-10-CM | POA: Diagnosis not present

## 2016-06-02 DIAGNOSIS — K219 Gastro-esophageal reflux disease without esophagitis: Secondary | ICD-10-CM | POA: Insufficient documentation

## 2016-06-02 HISTORY — PX: CERVICAL CONIZATION W/BX: SHX1330

## 2016-06-02 LAB — PREGNANCY, URINE: Preg Test, Ur: NEGATIVE

## 2016-06-02 SURGERY — CONE BIOPSY, CERVIX
Anesthesia: General

## 2016-06-02 MED ORDER — MEPERIDINE HCL 25 MG/ML IJ SOLN: 6.2500 mg | INTRAMUSCULAR | Status: DC | PRN

## 2016-06-02 MED ORDER — LIDOCAINE HCL (CARDIAC) 20 MG/ML IV SOLN
INTRAVENOUS | Status: AC
  Filled 2016-06-02: qty 5

## 2016-06-02 MED ORDER — SCOPOLAMINE 1 MG/3DAYS TD PT72
1.0000 | MEDICATED_PATCH | Freq: Once | TRANSDERMAL | Status: DC
  Administered 2016-06-02: 1.5 mg via TRANSDERMAL

## 2016-06-02 MED ORDER — IODINE STRONG (LUGOLS) 5 % PO SOLN
ORAL | Status: AC
  Filled 2016-06-02: qty 1

## 2016-06-02 MED ORDER — ONDANSETRON HCL 4 MG/2ML IJ SOLN
INTRAMUSCULAR | Status: DC | PRN
  Administered 2016-06-02: 4 mg via INTRAVENOUS

## 2016-06-02 MED ORDER — DEXAMETHASONE SODIUM PHOSPHATE 4 MG/ML IJ SOLN
INTRAMUSCULAR | Status: DC | PRN
  Administered 2016-06-02: 4 mg via INTRAVENOUS

## 2016-06-02 MED ORDER — EPHEDRINE SULFATE 50 MG/ML IJ SOLN
INTRAMUSCULAR | Status: DC | PRN
  Administered 2016-06-02: 10 mg via INTRAVENOUS

## 2016-06-02 MED ORDER — LIDOCAINE-EPINEPHRINE 1 %-1:100000 IJ SOLN
INTRAMUSCULAR | Status: DC | PRN
  Administered 2016-06-02: 5 mL

## 2016-06-02 MED ORDER — KETOROLAC TROMETHAMINE 30 MG/ML IJ SOLN
INTRAMUSCULAR | Status: AC
  Filled 2016-06-02: qty 1

## 2016-06-02 MED ORDER — ACETIC ACID 5 % SOLN
Status: AC
Start: 2016-06-02 — End: 2016-06-02
  Filled 2016-06-02: qty 500

## 2016-06-02 MED ORDER — FENTANYL CITRATE (PF) 100 MCG/2ML IJ SOLN
INTRAMUSCULAR | Status: DC | PRN
  Administered 2016-06-02 (×2): 50 ug via INTRAVENOUS

## 2016-06-02 MED ORDER — LACTATED RINGERS IV SOLN: INTRAVENOUS | Status: DC

## 2016-06-02 MED ORDER — DEXAMETHASONE SODIUM PHOSPHATE 4 MG/ML IJ SOLN
INTRAMUSCULAR | Status: AC
  Filled 2016-06-02: qty 1

## 2016-06-02 MED ORDER — MIDAZOLAM HCL 2 MG/2ML IJ SOLN
INTRAMUSCULAR | Status: AC
  Filled 2016-06-02: qty 2

## 2016-06-02 MED ORDER — SCOPOLAMINE 1 MG/3DAYS TD PT72
MEDICATED_PATCH | TRANSDERMAL | Status: AC
  Administered 2016-06-02: 1.5 mg via TRANSDERMAL
  Filled 2016-06-02: qty 1

## 2016-06-02 MED ORDER — LIDOCAINE HCL (CARDIAC) 20 MG/ML IV SOLN
INTRAVENOUS | Status: DC | PRN
  Administered 2016-06-02: 80 mg via INTRAVENOUS

## 2016-06-02 MED ORDER — HYDROMORPHONE HCL 1 MG/ML IJ SOLN: 0.2500 mg | INTRAMUSCULAR | Status: DC | PRN

## 2016-06-02 MED ORDER — FERRIC SUBSULFATE 259 MG/GM EX SOLN
CUTANEOUS | Status: AC
  Filled 2016-06-02: qty 8

## 2016-06-02 MED ORDER — PROPOFOL 10 MG/ML IV BOLUS
INTRAVENOUS | Status: DC | PRN
  Administered 2016-06-02: 200 mg via INTRAVENOUS

## 2016-06-02 MED ORDER — LACTATED RINGERS IV SOLN
INTRAVENOUS | Status: DC
  Administered 2016-06-02 (×2): via INTRAVENOUS

## 2016-06-02 MED ORDER — PROPOFOL 10 MG/ML IV BOLUS
INTRAVENOUS | Status: AC
  Filled 2016-06-02: qty 20

## 2016-06-02 MED ORDER — FENTANYL CITRATE (PF) 250 MCG/5ML IJ SOLN
INTRAMUSCULAR | Status: AC
  Filled 2016-06-02: qty 5

## 2016-06-02 MED ORDER — ONDANSETRON HCL 4 MG/2ML IJ SOLN
INTRAMUSCULAR | Status: AC
  Filled 2016-06-02: qty 2

## 2016-06-02 MED ORDER — KETOROLAC TROMETHAMINE 30 MG/ML IJ SOLN
INTRAMUSCULAR | Status: DC | PRN
  Administered 2016-06-02: 15 mg via INTRAVENOUS

## 2016-06-02 MED ORDER — MIDAZOLAM HCL 2 MG/2ML IJ SOLN
INTRAMUSCULAR | Status: DC | PRN
  Administered 2016-06-02: 2 mg via INTRAVENOUS

## 2016-06-02 MED ORDER — EPHEDRINE 5 MG/ML INJ
INTRAVENOUS | Status: AC
  Filled 2016-06-02: qty 10

## 2016-06-02 MED ORDER — LIDOCAINE-EPINEPHRINE 1 %-1:100000 IJ SOLN
INTRAMUSCULAR | Status: AC
  Filled 2016-06-02: qty 1

## 2016-06-02 MED ORDER — ACETAMINOPHEN 10 MG/ML IV SOLN
1000.0000 mg | Freq: Once | INTRAVENOUS | Status: AC
  Administered 2016-06-02: 1000 mg via INTRAVENOUS
  Filled 2016-06-02: qty 100

## 2016-06-02 MED ORDER — IODINE STRONG (LUGOLS) 5 % PO SOLN
ORAL | Status: DC | PRN
  Administered 2016-06-02: 16 mL via ORAL

## 2016-06-02 MED ORDER — PROMETHAZINE HCL 25 MG/ML IJ SOLN: 6.2500 mg | INTRAMUSCULAR | Status: DC | PRN

## 2016-06-02 SURGICAL SUPPLY — 24 items
APPLICATOR COTTON TIP 6IN STRL (MISCELLANEOUS) IMPLANT
BLADE SURG 11 STRL SS (BLADE) ×3 IMPLANT
CLOTH BEACON ORANGE TIMEOUT ST (SAFETY) ×3 IMPLANT
CONTAINER PREFILL 10% NBF 60ML (FORM) ×3 IMPLANT
COUNTER NEEDLE 1200 MAGNETIC (NEEDLE) IMPLANT
ELECT BALL LEEP 3MM BLK (ELECTRODE) IMPLANT
ELECT REM PT RETURN 9FT ADLT (ELECTROSURGICAL)
ELECTRODE REM PT RTRN 9FT ADLT (ELECTROSURGICAL) IMPLANT
GLOVE BIOGEL PI IND STRL 7.0 (GLOVE) ×2 IMPLANT
GLOVE BIOGEL PI INDICATOR 7.0 (GLOVE) ×4
GLOVE ECLIPSE 6.5 STRL STRAW (GLOVE) ×6 IMPLANT
GOWN STRL REUS W/TWL LRG LVL3 (GOWN DISPOSABLE) ×6 IMPLANT
NS IRRIG 1000ML POUR BTL (IV SOLUTION) ×3 IMPLANT
PACK VAGINAL MINOR WOMEN LF (CUSTOM PROCEDURE TRAY) ×3 IMPLANT
PAD OB MATERNITY 4.3X12.25 (PERSONAL CARE ITEMS) ×3 IMPLANT
PENCIL BUTTON HOLSTER BLD 10FT (ELECTRODE) IMPLANT
SCOPETTES 8  STERILE (MISCELLANEOUS)
SCOPETTES 8 STERILE (MISCELLANEOUS) IMPLANT
SPONGE SURGIFOAM ABS GEL 12-7 (HEMOSTASIS) IMPLANT
SUT SILK 2 0 FSL 18 (SUTURE) IMPLANT
SUT VIC AB 0 CT1 27 (SUTURE)
SUT VIC AB 0 CT1 27XBRD ANBCTR (SUTURE) IMPLANT
TOWEL OR 17X24 6PK STRL BLUE (TOWEL DISPOSABLE) ×6 IMPLANT
WATER STERILE IRR 1000ML POUR (IV SOLUTION) ×3 IMPLANT

## 2016-06-02 NOTE — Anesthesia Procedure Notes (Signed)
Procedure Name: LMA Insertion Date/Time: 06/02/2016 7:22 AM Performed by: Elbert EwingsHYMER, Geovonni Meyerhoff S Pre-anesthesia Checklist: Patient identified, Emergency Drugs available, Suction available and Patient being monitored Patient Re-evaluated:Patient Re-evaluated prior to inductionOxygen Delivery Method: Circle system utilized Preoxygenation: Pre-oxygenation with 100% oxygen Intubation Type: IV induction LMA Size: 4.0 Number of attempts: 1 Placement Confirmation: ETT inserted through vocal cords under direct vision

## 2016-06-02 NOTE — Op Note (Signed)
Preoperative Diagnosis: VAIN II  Postoperative Diagnosis: Same  Procedure: Colposcopy and ablation of vaginal dysplasia with J-Plasma  Surgeon: Dr Gertie ExonJill Oralia Hodge  Assistants: None  Anesthesia: General via LMA   EBL: minimal   Fluids: 1,000 cc LR  Urine output: voided prior to coming to the OR  Indications for surgery: The patient is a 35 yo female, who presented with an ASCUS, +HPV pap. Work up included a colposcopy that was unsatisfactory. Cervical biopsy returned with CIN 1, ECC was negative and vaginal biopsy returned with VAIN II. The options of treatment were reviewed with the patient.  The risks of the surgery were reviewed with the patient and the consent form was signed prior to her surgery.  Findings: Colposcopy of the vagina revealed a few small areas on the right and left side of the vagina with decreased lugols uptake. No cervical abnormalities were noted  Specimens: none  Procedure: The patient was taken to the operating room with an IV in place. She was placed in the dorsal lithotomy position and anesthesia was administered. She was prepped and draped in the usual sterile fashion for a vaginal procedure. She voided on the way to the OR. A speculum was placed in the vagina and lugols solution was applied on the cervix and throughout the vagina. The area's of vaginal dysplasia were all ablated using the J-Plasma. Power setting were at 40 with 4 liters of flow. There was no bleeding.  The areas of the vagina that were ablated were then injected with 1% lidocaine with epinephrine.   The patients perineum was cleansed of betadine and she was taken out of the dorsal lithotomy position.  Upon awakening the LMA was removed and the patient was transferred to the recovery room in stable and awake condition.   The sponge and instrument count were correct. There were no complications.

## 2016-06-02 NOTE — Anesthesia Postprocedure Evaluation (Signed)
Anesthesia Post Note  Patient: Hannah Hodge  Procedure(s) Performed: Procedure(s) (LRB): J PLASMA LASER WITH REMOVAL OF VAGINAL DYSPLASIA (N/A)  Patient location during evaluation: PACU Anesthesia Type: General Level of consciousness: awake and alert Pain management: pain level controlled Vital Signs Assessment: post-procedure vital signs reviewed and stable Respiratory status: spontaneous breathing, nonlabored ventilation, respiratory function stable and patient connected to nasal cannula oxygen Cardiovascular status: blood pressure returned to baseline and stable Postop Assessment: no signs of nausea or vomiting Anesthetic complications: no     Last Vitals:  Filed Vitals:   06/02/16 0845 06/02/16 0851  BP: 112/76   Pulse: 58   Temp:  36.9 C  Resp: 17     Last Pain: There were no vitals filed for this visit. Pain Goal: Patients Stated Pain Goal: 3 (06/02/16 0620)               Shelton SilvasKevin D Mahamadou Weltz

## 2016-06-02 NOTE — Interval H&P Note (Signed)
History and Physical Interval Note:  06/02/2016 7:12 AM  [name]  has presented today for surgery, with the diagnosis of VAIN 2  The various methods of treatment have been discussed with the patient and family. After consideration of risks, benefits and other options for treatment, the patient has consented to  Procedure(s) with comments: J PLASMA LASER WITH REMOVAL OF VAGINAL DYSPLASIA (N/A) - will use J-plasma.  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  J-plasma rep confirmed on 05/26/16.  Kennon RoundsSally notified as a surgical intervention .  The patient's history has been reviewed, patient examined, no change in status, stable for surgery.  I have reviewed the patient's chart and labs.  Questions were answered to the patient's satisfaction.     Romualdo BolkJill Evelyn Jertson

## 2016-06-02 NOTE — Discharge Instructions (Signed)
DISCHARGE INSTRUCTIONS: The following instructions have been prepared to help you care for yourself upon your return home.   Personal hygiene:  Use sanitary pads for vaginal drainage, not tampons.  Shower the day after your procedure.  NO tub baths, pools or Jacuzzis for 2-3 weeks.  Wipe front to back after using the bathroom.  Activity and limitations:  Do NOT drive or operate any equipment for 24 hours. The effects of anesthesia are still present and drowsiness may result.  Do NOT rest in bed all day.  Walking is encouraged.  Walk up and down stairs slowly.  You may resume your normal activity in one to two days or as indicated by your physician.  Sexual activity: NO intercourse for at least 2 weeks after the procedure, or as indicated by your physician.  Diet: Eat a light meal as desired this evening. You may resume your usual diet tomorrow.  Return to work: You may resume your work activities in one to two days or as indicated by your doctor.  What to expect after your surgery: Expect to have vaginal bleeding/discharge for 2-3 days and spotting for up to 10 days. It is not unusual to have soreness for up to 1-2 weeks. You may have a slight burning sensation when you urinate for the first day. Mild cramps may continue for a couple of days. You may have a regular period in 2-6 weeks.  NO IBUPROFEN PRODUCTS (MOTRIN, ADVIL) OR ALEVE UNTIL 1:45PM TODAY.  SCOPE PATCH MAY BE REMOVED ON OR BEFORE 06/05/16.   Call your doctor for any of the following:  Excessive vaginal bleeding, saturating and changing one pad every hour.  Inability to urinate 6 hours after discharge from hospital.  Pain not relieved by pain medication.  Fever of 100.4 F or greater.  Unusual vaginal discharge or odor.   Call for an appointment:    Patients signature: ______________________  Nurses signature ________________________  Support person's signature_______________________

## 2016-06-02 NOTE — Transfer of Care (Signed)
Immediate Anesthesia Transfer of Care Note  Patient: Charline Billsatherine Schwarz  Procedure(s) Performed: Procedure(s) with comments: J PLASMA LASER WITH REMOVAL OF VAGINAL DYSPLASIA (N/A) - will use J-plasma.  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  J-plasma rep confirmed on 05/26/16.  Kennon RoundsSally notified  Patient Location: PACU  Anesthesia Type:General  Level of Consciousness: awake, alert  and oriented  Airway & Oxygen Therapy: Patient Spontanous Breathing and Patient connected to nasal cannula oxygen  Post-op Assessment: Report given to RN and Post -op Vital signs reviewed and stable  Post vital signs: Reviewed and stable  Last Vitals:  Filed Vitals:   06/02/16 0620  BP: 115/76  Pulse: 58  Temp: 36.8 C  Resp: 16    Last Pain: There were no vitals filed for this visit.    Patients Stated Pain Goal: 3 (06/02/16 [id number])  Complications: No apparent anesthesia complications

## 2016-06-03 NOTE — Telephone Encounter (Signed)
Patient came for office visit and surgery discussed. See office visit notes.  Routing to provider for final review. Will close encounter.

## 2016-06-05 ENCOUNTER — Encounter (HOSPITAL_COMMUNITY): Payer: Self-pay | Admitting: Obstetrics and Gynecology

## 2016-06-23 ENCOUNTER — Encounter: Payer: Self-pay | Admitting: Obstetrics and Gynecology

## 2016-06-23 ENCOUNTER — Ambulatory Visit (INDEPENDENT_AMBULATORY_CARE_PROVIDER_SITE_OTHER): Payer: BLUE CROSS/BLUE SHIELD | Admitting: Obstetrics and Gynecology

## 2016-06-23 VITALS — BP 110/70 | HR 72 | Resp 14 | Ht 62.0 in | Wt 179.6 lb

## 2016-06-23 DIAGNOSIS — N893 Dysplasia of vagina, unspecified: Secondary | ICD-10-CM

## 2016-06-23 NOTE — Progress Notes (Signed)
GYNECOLOGY  VISIT   HPI: 35 y.o.   Single  Caucasian  female   [phone] with Patient's last menstrual period was 05/27/2016 (approximate).   here for Post-Op from 06/02/16, she had laser of vaginal dysplasia (J plasma). She has been a little sore, but getting better. She had increased vaginal d/c for a week, now better. Only occasional discomfort.    GYNECOLOGIC HISTORY: Patient's last menstrual period was 05/27/2016 (approximate). Contraception:OCP Menopausal hormone therapy: None        OB History    Gravida Para Term Preterm AB TAB SAB Ectopic Multiple Living   3 1 1  2  2   1          Patient Active Problem List   Diagnosis Date Noted  . Environmental allergies   . Migraine with aura and without status migrainosus, not intractable 02/15/2016  . Essential hypertension 02/15/2016    Past Medical History  Diagnosis Date  . Anxiety   . Hypertension   . Migraine   . Dysrhythmia     tachycardia with anxiety  . Shortness of breath dyspnea     with anxiety  . Thyroid nodule     benign  . GERD (gastroesophageal reflux disease)   . Environmental allergies     Past Surgical History  Procedure Laterality Date  . Cesarean section    . Colposcopy    . Biopsy thyroid    . Cervical conization w/bx N/A 06/02/2016    Procedure: J PLASMA LASER WITH REMOVAL OF VAGINAL DYSPLASIA;  Surgeon: Romualdo BolkJill Evelyn Jarron Curley, MD;  Location: [facility];  Service: Gynecology;  Laterality: N/A;  will use J-plasma.  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  J-plasma rep confirmed on 05/26/16.  Kennon RoundsSally notified    Current Outpatient Prescriptions  Medication Sig Dispense Refill  . buPROPion (WELLBUTRIN XL) 150 MG 24 hr tablet Take 150 mg by mouth daily.   0  . busPIRone (BUSPAR) 15 MG tablet Take 15 mg by mouth 2 (two) times daily as needed (anxiety).   0  . cholecalciferol (VITAMIN D) 1000 units tablet Take 1,000 Units by mouth daily.    Marland Kitchen. ibuprofen (ADVIL,MOTRIN) 200 MG tablet Take 600 mg by mouth every 6 (six) hours  as needed for headache or moderate pain.    . metoprolol (LOPRESSOR) 50 MG tablet Take 50 mg by mouth daily.    . montelukast (SINGULAIR) 10 MG tablet Take 10 mg by mouth at bedtime.   2  . Multiple Vitamin (MULTIVITAMIN) tablet Take 1 tablet by mouth daily.    . norethindrone (MICRONOR,CAMILA,ERRIN) 0.35 MG tablet Take 1 tablet (0.35 mg total) by mouth daily. 3 Package 2  . omeprazole (PRILOSEC OTC) 20 MG tablet Take 20 mg by mouth daily.    . rizatriptan (MAXALT) 10 MG tablet Take 10 mg by mouth as needed for migraine.     . traZODone (DESYREL) 100 MG tablet Take 100-200 mg by mouth at bedtime. sleep     No current facility-administered medications for this visit.     ALLERGIES: Review of patient's allergies indicates no known allergies.  Family History  Problem Relation Age of Onset  . Hypertension Mother   . Atrial fibrillation Mother   . Migraines Mother   . Migraines Sister   . Migraines Brother   . Dementia Maternal Grandmother   . Leukemia Maternal Grandfather   . Migraines Maternal Grandfather     Social History   Social History  . Marital Status: Single  Spouse Name: N/A  . Number of Children: N/A  . Years of Education: N/A   Occupational History  . Not on file.   Social History Main Topics  . Smoking status: Never Smoker   . Smokeless tobacco: Never Used  . Alcohol Use: No  . Drug Use: No  . Sexual Activity:    Partners: Male    Birth Control/ Protection: Pill   Other Topics Concern  . Not on file   Social History Narrative    Review of Systems  Constitutional: Negative.   HENT: Negative.   Eyes: Negative.   Respiratory: Negative.   Cardiovascular: Negative.   Gastrointestinal: Negative.   Genitourinary: Negative.   Musculoskeletal: Negative.   Skin: Negative.   Neurological: Negative.   Endo/Heme/Allergies: Negative.   Psychiatric/Behavioral: Negative.     PHYSICAL EXAMINATION:    BP 110/70 mmHg  Pulse 72  Resp 14  Ht 5\' 2"  (1.575  m)  Wt 179 lb 9.6 oz (81.466 kg)  BMI 32.84 kg/m2  LMP 05/27/2016 (Approximate)    General appearance: alert, cooperative and appears stated age  Pelvic: External genitalia:  no lesions              Urethra:  normal appearing urethra with no masses, tenderness or lesions              Bartholins and Skenes: normal                 Vagina: normal appearing vagina well healed from her surgery              Cervix: no lesion              Bimanual Exam:  Not tender with palpation of the vagina  Chaperone was present for exam.  ASSESSMENT VAIN II, s/p laser treatment, well healed   PLAN F/U pap with hpv in 6 months Call with any concerns   An After Visit Summary was printed and given to the patient.

## 2016-12-29 ENCOUNTER — Ambulatory Visit: Payer: BLUE CROSS/BLUE SHIELD | Admitting: Obstetrics and Gynecology

## 2016-12-29 IMAGING — US US SOFT TISSUE HEAD/NECK
1 series · 13 of 25 positions shown · non-contrast
Comparison: None.

CLINICAL DATA: Potential right-sided thyroid nodule felt on
physical examination.

EXAM:
THYROID ULTRASOUND
TECHNIQUE: Ultrasound examination of the thyroid gland and adjacent soft
tissues was performed.

[Series 1: us soft tissue head/neck · 0.07mm/px · 13 of 40 slices shown]
[im 1/40]
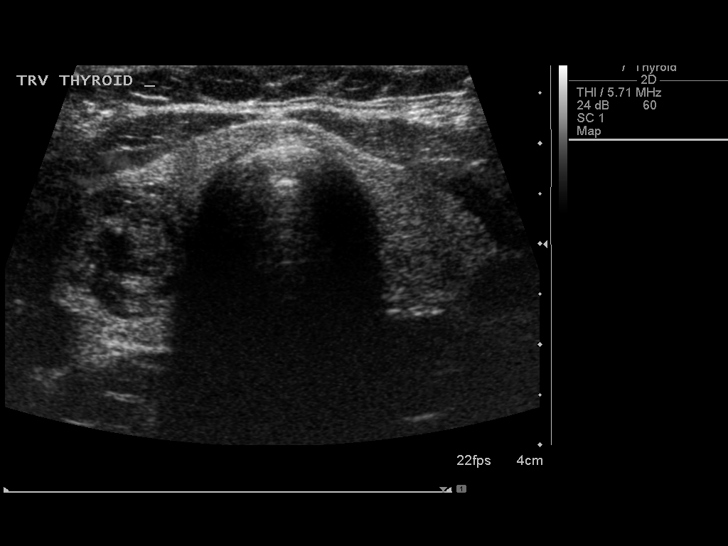
[im 4/40]
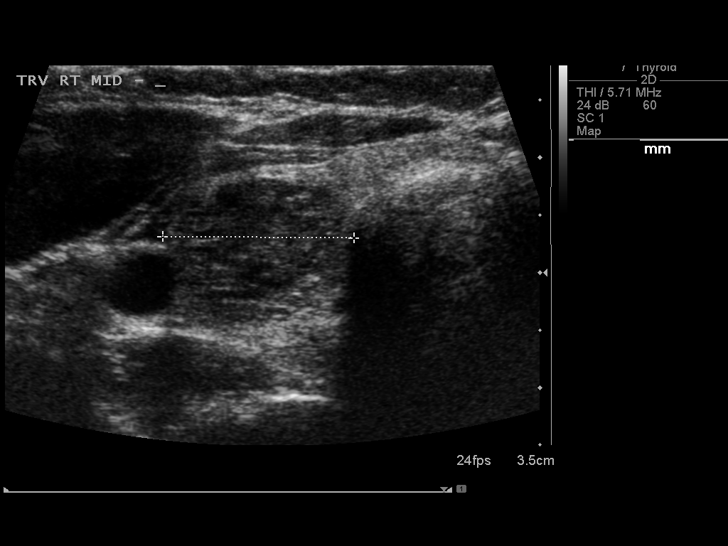
[im 7/40]
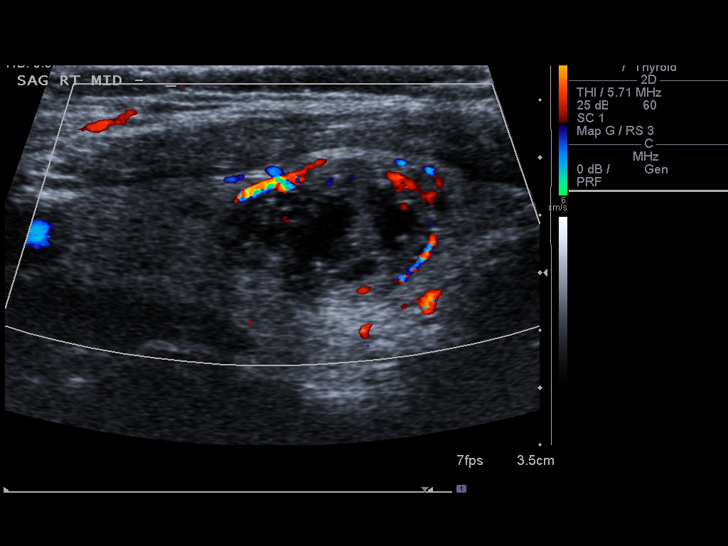
[im 10/40]
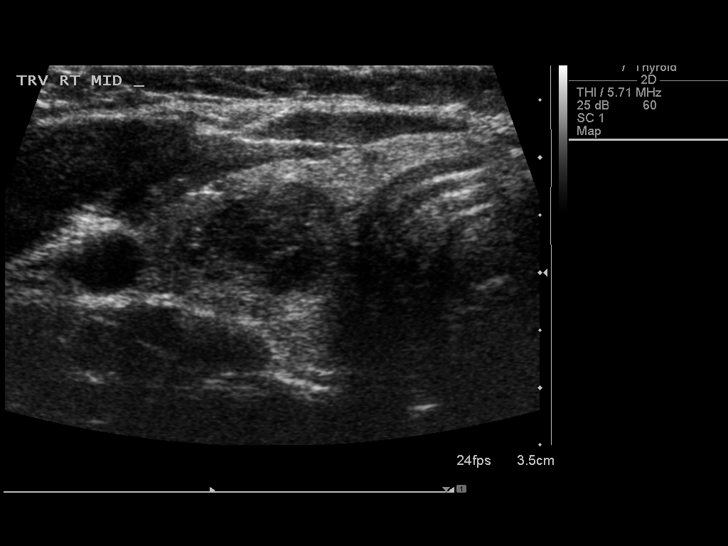
[im 14/40]
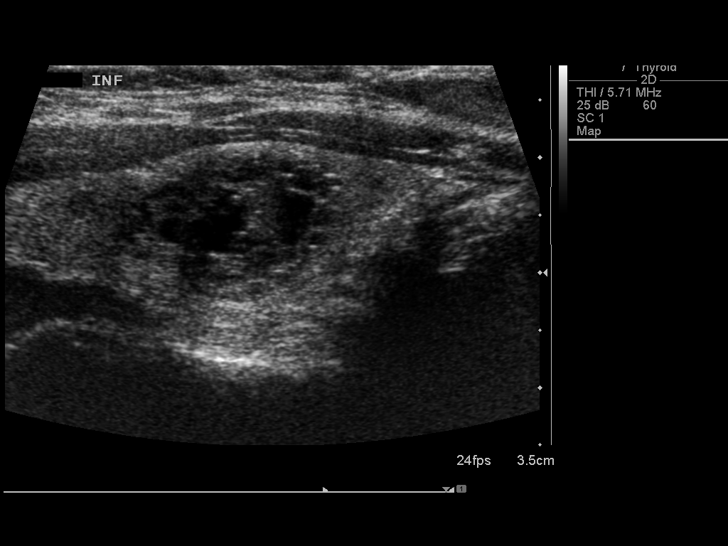
[im 17/40]
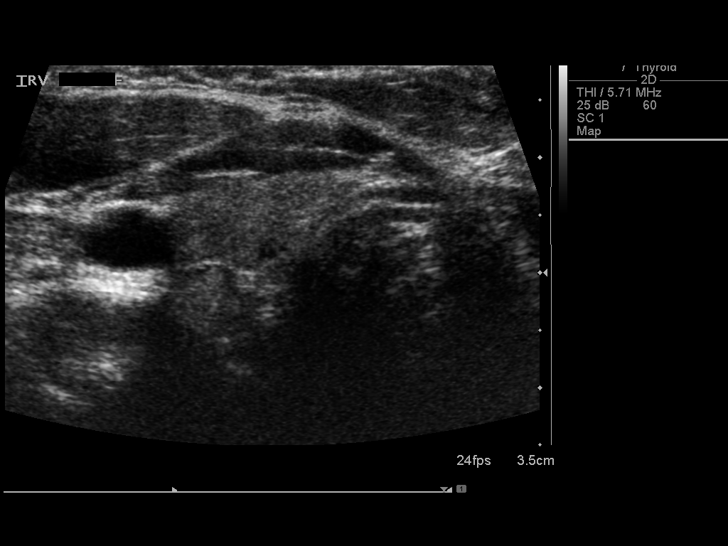
[im 20/40]
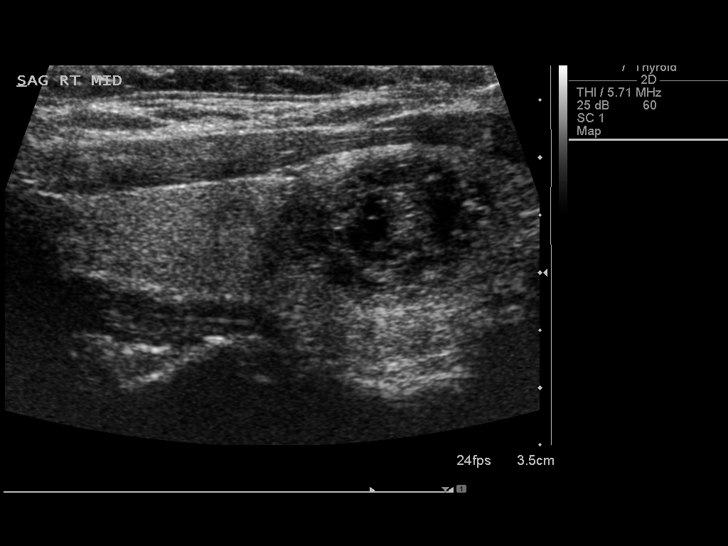
[im 23/40]
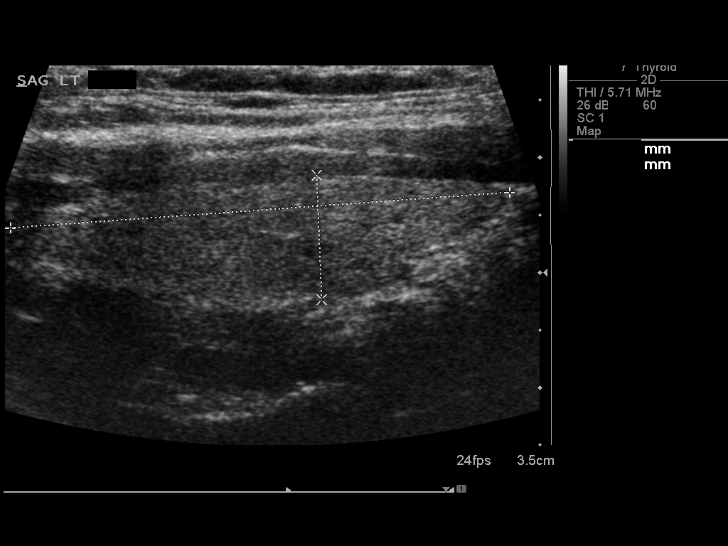
[im 27/40]
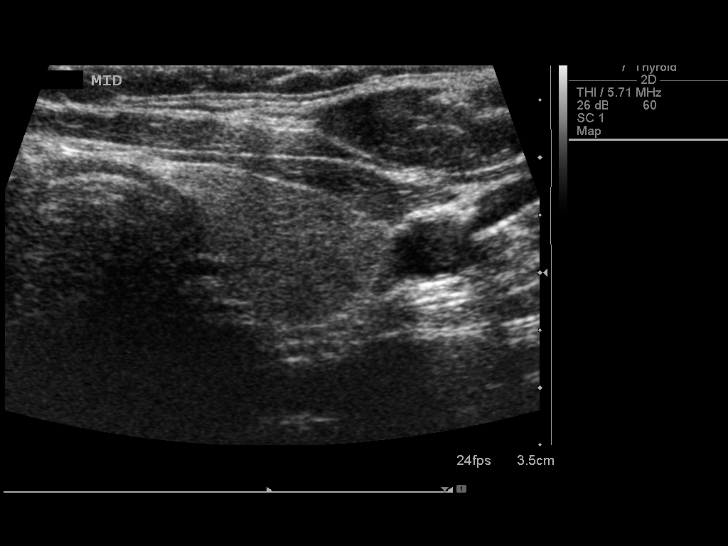
[im 30/40]
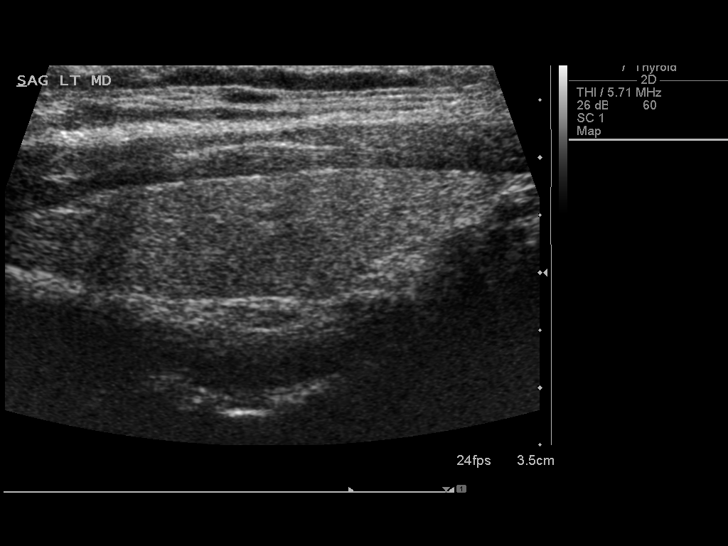
[im 33/40]
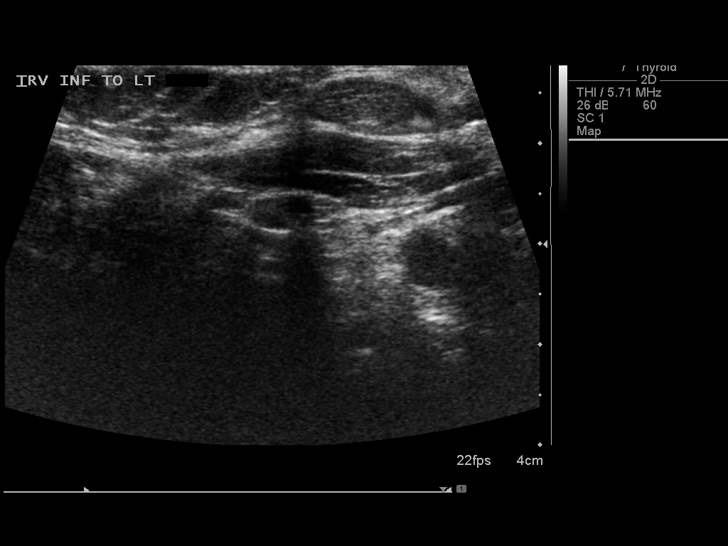
[im 36/40]
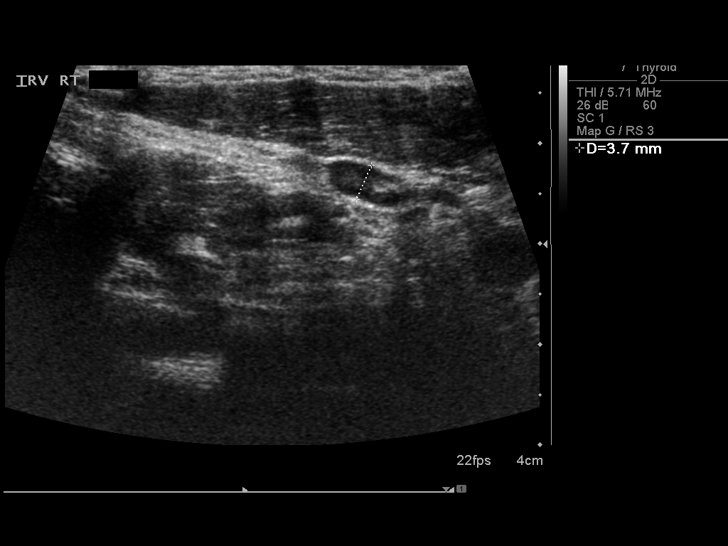
[im 40/40]
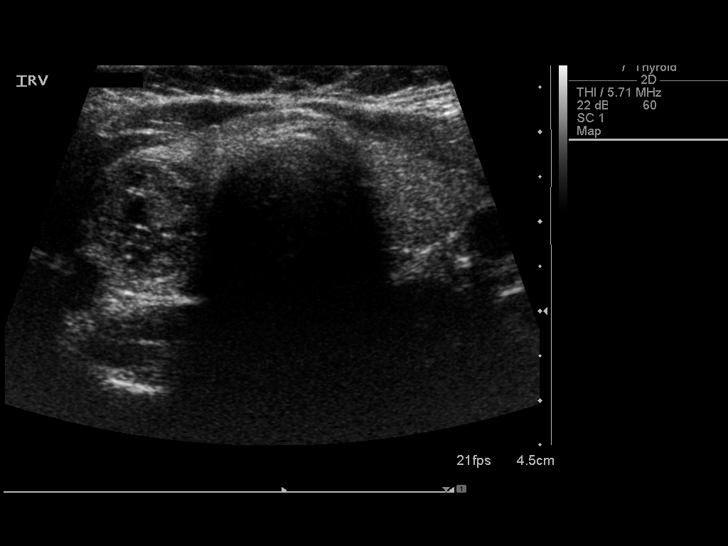

[13 of 25 positions shown; findings below may reference images not displayed]

FINDINGS: There is relative homogeneity of the thyroid parenchymal
echotexture.

Right thyroid lobe

Measurements: Normal in size measuring 4.5 x 1.9 x 1.7 cm

Right, mid - 1.5 x 1.3 x 2.1 cm - mixed echogenic, partially cystic,
predominantly solid.

Left thyroid lobe

Measurements: Normal in size measuring 4.4 x 1.1 x 1.3 cm.

No discrete nodule or mass is identified within the left lobe of the
thyroid.

Isthmus

Thickness: Normal in size measures 0.2 cm in diameter

No discrete nodule or mass is identified within the thyroid isthmus.

Lymphadenopathy

None visualized.
IMPRESSION: Solitary indeterminate approximately 2.1 cm nodule within the right
lobe of the thyroid likely correlates with the palpable area of
concern and meets imaging criteria to recommend percutaneous
sampling as clinically indicated. This recommendation follows the
consensus statement: Management of Thyroid Nodules Detected at US:
Society of Radiologists in Ultrasound Consensus Conference

## 2017-01-06 ENCOUNTER — Telehealth: Payer: Self-pay | Admitting: Obstetrics and Gynecology

## 2017-01-06 NOTE — Telephone Encounter (Signed)
Call to patient about canceled appointment due to surgery conflict.

## 2017-01-09 ENCOUNTER — Ambulatory Visit: Payer: BLUE CROSS/BLUE SHIELD | Admitting: Obstetrics and Gynecology

## 2017-01-12 ENCOUNTER — Ambulatory Visit (INDEPENDENT_AMBULATORY_CARE_PROVIDER_SITE_OTHER): Payer: BLUE CROSS/BLUE SHIELD | Admitting: Obstetrics and Gynecology

## 2017-01-12 ENCOUNTER — Encounter: Payer: Self-pay | Admitting: Obstetrics and Gynecology

## 2017-01-12 VITALS — BP 116/70 | HR 72 | Resp 16 | Ht 63.5 in | Wt 185.0 lb

## 2017-01-12 DIAGNOSIS — Z87411 Personal history of vaginal dysplasia: Secondary | ICD-10-CM

## 2017-01-12 DIAGNOSIS — Z124 Encounter for screening for malignant neoplasm of cervix: Secondary | ICD-10-CM

## 2017-01-12 MED ORDER — NORETHINDRONE 0.35 MG PO TABS: 1.0000 | ORAL_TABLET | Freq: Every day | ORAL | 0 refills | Status: DC

## 2017-01-12 NOTE — Progress Notes (Signed)
GYNECOLOGY  VISIT   HPI: 36 y.o.   Single  Caucasian  female   [id number] with No LMP recorded. Patient is not currently having periods (Reason: Oral contraceptives).   here for   36month pap smear; patient needs refills of OCP. She is on the mini-pill, no real cycles but has symptoms of her cycles. Mild cramps, headaches, sometime spots.  She is s/p ablation of vaginal dysplasia in 6/17.   GYNECOLOGIC HISTORY: No LMP recorded. Patient is not currently having periods (Reason: Oral contraceptives). Contraception:OCP Menopausal hormone therapy: N/A        OB History    Gravida Para Term Preterm AB Living   3 1 1   2 1    SAB TAB Ectopic Multiple Live Births   2       1         Patient Active Problem List   Diagnosis Date Noted  . Environmental allergies   . Migraine with aura and without status migrainosus, not intractable 02/15/2016  . Essential hypertension 02/15/2016    Past Medical History:  Diagnosis Date  . Anxiety   . Dysrhythmia    tachycardia with anxiety  . Environmental allergies   . GERD (gastroesophageal reflux disease)   . Hypertension   . Migraine   . Shortness of breath dyspnea    with anxiety  . Thyroid nodule    benign    Past Surgical History:  Procedure Laterality Date  . BIOPSY THYROID    . CERVICAL CONIZATION W/BX N/A 06/02/2016   Procedure: J PLASMA LASER WITH REMOVAL OF VAGINAL DYSPLASIA;  Surgeon: Romualdo BolkJill Evelyn Jertson, MD;  Location: [facility];  Service: Gynecology;  Laterality: N/A;  will use J-plasma.  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  J-plasma rep confirmed on 05/26/16.  Kennon RoundsSally notified  . CESAREAN SECTION    . COLPOSCOPY      Current Outpatient Prescriptions  Medication Sig Dispense Refill  . buPROPion (WELLBUTRIN XL) 150 MG 24 hr tablet Take 150 mg by mouth daily.   0  . busPIRone (BUSPAR) 15 MG tablet Take 15 mg by mouth 2 (two) times daily as needed (anxiety).   0  . cholecalciferol (VITAMIN D) 1000 units tablet Take 1,000 Units by mouth daily.     . famotidine (PEPCID) 20 MG tablet Take 20 mg by mouth.    . fluticasone (FLONASE) 50 MCG/ACT nasal spray Place into the nose.    . ibuprofen (ADVIL,MOTRIN) 200 MG tablet Take 600 mg by mouth every 6 (six) hours as needed for headache or moderate pain.    . metoprolol (LOPRESSOR) 50 MG tablet Take 50 mg by mouth daily.    . montelukast (SINGULAIR) 10 MG tablet Take 10 mg by mouth at bedtime.   2  . Multiple Vitamin (MULTIVITAMIN) tablet Take 1 tablet by mouth daily.    . norethindrone (MICRONOR,CAMILA,ERRIN) 0.35 MG tablet Take 1 tablet (0.35 mg total) by mouth daily. 3 Package 2  . omeprazole (PRILOSEC OTC) 20 MG tablet Take 20 mg by mouth daily.    . rizatriptan (MAXALT) 10 MG tablet Take 10 mg by mouth as needed for migraine.     . traZODone (DESYREL) 100 MG tablet Take 100-200 mg by mouth at bedtime. sleep    . vitamin E 1000 UNIT capsule Take by mouth.     No current facility-administered medications for this visit.      ALLERGIES: Patient has no known allergies.  Family History  Problem Relation Age of Onset  .  Hypertension Mother   . Atrial fibrillation Mother   . Migraines Mother   . Migraines Sister   . Migraines Brother   . Dementia Maternal Grandmother   . Leukemia Maternal Grandfather   . Migraines Maternal Grandfather     Social History   Social History  . Marital status: Single    Spouse name: N/A  . Number of children: N/A  . Years of education: N/A   Occupational History  . Not on file.   Social History Main Topics  . Smoking status: Never Smoker  . Smokeless tobacco: Never Used  . Alcohol use No  . Drug use: No  . Sexual activity: Not Currently    Partners: Male    Birth control/ protection: Pill   Other Topics Concern  . Not on file   Social History Narrative  . No narrative on file    Review of Systems  Constitutional:       Weight gain Craving sweets  HENT: Negative.   Eyes: Negative.   Respiratory: Negative.   Cardiovascular:  Negative.   Gastrointestinal: Negative.   Genitourinary:       Loss of urine with sneeze or cough occasionally  Musculoskeletal: Negative.   Skin: Negative.   Neurological: Positive for headaches.  Endo/Heme/Allergies: Negative.   Psychiatric/Behavioral: The patient is nervous/anxious.     PHYSICAL EXAMINATION:    BP 116/70 (BP Location: Right Arm, Patient Position: Sitting, Cuff Size: Large)   Pulse 72   Resp 16   Ht 5' 3.5" (1.613 m)   Wt 185 lb (83.9 kg)   BMI 32.26 kg/m     General appearance: alert, cooperative and appears stated age  Pelvic: External genitalia:  no lesions              Urethra:  normal appearing urethra with no masses, tenderness or lesions              Bartholins and Skenes: normal                 Vagina: normal appearing vagina with normal color and discharge, no lesions              Cervix:without lesions  Chaperone was present for exam.  ASSESSMENT H/O VAIN II, s/p ablation in 6/17    PLAN F/U pap with hpv Due for annual exam in 3/18   An After Visit Summary was printed and given to the patient.

## 2017-01-16 LAB — IPS PAP TEST WITH HPV

## 2017-02-20 ENCOUNTER — Ambulatory Visit: Payer: BLUE CROSS/BLUE SHIELD | Admitting: Obstetrics and Gynecology

## 2017-02-23 ENCOUNTER — Encounter: Payer: Self-pay | Admitting: Obstetrics and Gynecology

## 2017-02-23 ENCOUNTER — Ambulatory Visit (INDEPENDENT_AMBULATORY_CARE_PROVIDER_SITE_OTHER): Payer: BLUE CROSS/BLUE SHIELD | Admitting: Obstetrics and Gynecology

## 2017-02-23 VITALS — BP 118/70 | HR 68 | Resp 16 | Ht 63.5 in | Wt 178.0 lb

## 2017-02-23 DIAGNOSIS — Z87411 Personal history of vaginal dysplasia: Secondary | ICD-10-CM

## 2017-02-23 DIAGNOSIS — Z01419 Encounter for gynecological examination (general) (routine) without abnormal findings: Secondary | ICD-10-CM

## 2017-02-23 DIAGNOSIS — Z0000 Encounter for general adult medical examination without abnormal findings: Secondary | ICD-10-CM | POA: Diagnosis not present

## 2017-02-23 LAB — COMPREHENSIVE METABOLIC PANEL
ALT: 18 U/L (ref 6–29)
AST: 19 U/L (ref 10–30)
Albumin: 4.6 g/dL (ref 3.6–5.1)
Alkaline Phosphatase: 66 U/L (ref 33–115)
BILIRUBIN TOTAL: 0.6 mg/dL (ref 0.2–1.2)
BUN: 11 mg/dL (ref 7–25)
CALCIUM: 10 mg/dL (ref 8.6–10.2)
CO2: 28 mmol/L (ref 20–31)
CREATININE: 1.05 mg/dL (ref 0.50–1.10)
Chloride: 104 mmol/L (ref 98–110)
GLUCOSE: 83 mg/dL (ref 65–99)
Potassium: 5.1 mmol/L (ref 3.5–5.3)
SODIUM: 140 mmol/L (ref 135–146)
Total Protein: 7 g/dL (ref 6.1–8.1)

## 2017-02-23 LAB — LIPID PANEL
Cholesterol: 156 mg/dL (ref <200)
HDL: 45 mg/dL — AB (ref >50)
LDL CALC: 93 mg/dL (ref <100)
Total CHOL/HDL Ratio: 3.5 Ratio (ref <5.0)
Triglycerides: 88 mg/dL (ref <150)
VLDL: 18 mg/dL (ref <30)

## 2017-02-23 LAB — CBC
HCT: 41.6 % (ref 35.0–45.0)
Hemoglobin: 13.7 g/dL (ref 11.7–15.5)
MCH: 28.6 pg (ref 27.0–33.0)
MCHC: 32.9 g/dL (ref 32.0–36.0)
MCV: 86.8 fL (ref 80.0–100.0)
MPV: 10.3 fL (ref 7.5–12.5)
PLATELETS: 312 10*3/uL (ref 140–400)
RBC: 4.79 MIL/uL (ref 3.80–5.10)
RDW: 13.1 % (ref 11.0–15.0)
WBC: 8.8 10*3/uL (ref 3.8–10.8)

## 2017-02-23 MED ORDER — NORETHINDRONE 0.35 MG PO TABS: 1.0000 | ORAL_TABLET | Freq: Every day | ORAL | 3 refills | Status: DC

## 2017-02-23 NOTE — Patient Instructions (Signed)
EXERCISE AND DIET:  We recommended that you start or continue a regular exercise program for good health. Regular exercise means any activity that makes your heart beat faster and makes you sweat.  We recommend exercising at least 30 minutes per day at least 3 days a week, preferably 4 or 5.  We also recommend a diet low in fat and sugar.  Inactivity, poor dietary choices and obesity can cause diabetes, heart attack, stroke, and kidney damage, among others.    ALCOHOL AND SMOKING:  Women should limit their alcohol intake to no more than 7 drinks/beers/glasses of wine (combined, not each!) per week. Moderation of alcohol intake to this level decreases your risk of breast cancer and liver damage. And of course, no recreational drugs are part of a healthy lifestyle.  And absolutely no smoking or even second hand smoke. Most people know smoking can cause heart and lung diseases, but did you know it also contributes to weakening of your bones? Aging of your skin?  Yellowing of your teeth and nails?  CALCIUM AND VITAMIN D:  Adequate intake of calcium and Vitamin D are recommended.  The recommendations for exact amounts of these supplements seem to change often, but generally speaking 600 mg of calcium (either carbonate or citrate) and 800 units of Vitamin D per day seems prudent. Certain women may benefit from higher intake of Vitamin D.  If you are among these women, your doctor will have told you during your visit.    PAP SMEARS:  Pap smears, to check for cervical cancer or precancers,  have traditionally been done yearly, although recent scientific advances have shown that most women can have pap smears less often.  However, every woman still should have a physical exam from her gynecologist every year. It will include a breast check, inspection of the vulva and vagina to check for abnormal growths or skin changes, a visual exam of the cervix, and then an exam to evaluate the size and shape of the uterus and  ovaries.  And after 36 years of age, a rectal exam is indicated to check for rectal cancers. We will also provide age appropriate advice regarding health maintenance, like when you should have certain vaccines, screening for sexually transmitted diseases, bone density testing, colonoscopy, mammograms, etc.   MAMMOGRAMS:  All women over 40 years old should have a yearly mammogram. Many facilities now offer a "3D" mammogram, which may cost around $50 extra out of pocket. If possible,  we recommend you accept the option to have the 3D mammogram performed.  It both reduces the number of women who will be called back for extra views which then turn out to be normal, and it is better than the routine mammogram at detecting truly abnormal areas.    COLONOSCOPY:  Colonoscopy to screen for colon cancer is recommended for all women at age 50.  We know, you hate the idea of the prep.  We agree, BUT, having colon cancer and not knowing it is worse!!  Colon cancer so often starts as a polyp that can be seen and removed at colonscopy, which can quite literally save your life!  And if your first colonoscopy is normal and you have no family history of colon cancer, most women don't have to have it again for 10 years.  Once every ten years, you can do something that may end up saving your life, right?  We will be happy to help you get it scheduled when you are ready.    Be sure to check your insurance coverage so you understand how much it will cost.  It may be covered as a preventative service at no cost, but you should check your particular policy.      Breast Self-Awareness Breast self-awareness means being familiar with how your breasts look and feel. It involves checking your breasts regularly and reporting any changes to your health care provider. Practicing breast self-awareness is important. A change in your breasts can be a sign of a serious medical problem. Being familiar with how your breasts look and feel allows  you to find any problems early, when treatment is more likely to be successful. All women should practice breast self-awareness, including women who have had breast implants. How to do a breast self-exam One way to learn what is normal for your breasts and whether your breasts are changing is to do a breast self-exam. To do a breast self-exam: Look for Changes   1. Remove all the clothing above your waist. 2. Stand in front of a mirror in a room with good lighting. 3. Put your hands on your hips. 4. Push your hands firmly downward. 5. Compare your breasts in the mirror. Look for differences between them (asymmetry), such as:  Differences in shape.  Differences in size.  Puckers, dips, and bumps in one breast and not the other. 6. Look at each breast for changes in your skin, such as:  Redness.  Scaly areas. 7. Look for changes in your nipples, such as:  Discharge.  Bleeding.  Dimpling.  Redness.  A change in position. Feel for Changes   Carefully feel your breasts for lumps and changes. It is best to do this while lying on your back on the floor and again while sitting or standing in the shower or tub with soapy water on your skin. Feel each breast in the following way:  Place the arm on the side of the breast you are examining above your head.  Feel your breast with the other hand.  Start in the nipple area and make  inch (2 cm) overlapping circles to feel your breast. Use the pads of your three middle fingers to do this. Apply light pressure, then medium pressure, then firm pressure. The light pressure will allow you to feel the tissue closest to the skin. The medium pressure will allow you to feel the tissue that is a little deeper. The firm pressure will allow you to feel the tissue close to the ribs.  Continue the overlapping circles, moving downward over the breast until you feel your ribs below your breast.  Move one finger-width toward the center of the body.  Continue to use the  inch (2 cm) overlapping circles to feel your breast as you move slowly up toward your collarbone.  Continue the up and down exam using all three pressures until you reach your armpit. Write Down What You Find   Write down what is normal for each breast and any changes that you find. Keep a written record with breast changes or normal findings for each breast. By writing this information down, you do not need to depend only on memory for size, tenderness, or location. Write down where you are in your menstrual cycle, if you are still menstruating. If you are having trouble noticing differences in your breasts, do not get discouraged. With time you will become more familiar with the variations in your breasts and more comfortable with the exam. How often should I examine my   breasts? Examine your breasts every month. If you are breastfeeding, the best time to examine your breasts is after a feeding or after using a breast pump. If you menstruate, the best time to examine your breasts is 5-7 days after your period is over. During your period, your breasts are lumpier, and it may be more difficult to notice changes. When should I see my health care provider? See your health care provider if you notice:  A change in shape or size of your breasts or nipples.  A change in the skin of your breast or nipples, such as a reddened or scaly area.  Unusual discharge from your nipples.  A lump or thick area that was not there before.  Pain in your breasts.  Anything that concerns you. This information is not intended to replace advice given to you by your health care provider. Make sure you discuss any questions you have with your health care provider. Document Released: 12/05/2005 Document Revised: 05/12/2016 Document Reviewed: 10/25/2015 Elsevier Interactive Patient Education  2017 [employer]  

## 2017-02-23 NOTE — Progress Notes (Signed)
36 y.o. [id number] SingleCaucasianF here for annual exam.  The patient is on POP (off OCP's secondary to migraine with aura)  Period Duration (Days): 7- 10 days mostly spotting  Period Pattern: (!) Irregular Menstrual Flow: Light Menstrual Control: Thin pad Menstrual Control Change Freq (Hours): changes pad every 2-3 times a day  Dysmenorrhea: (!) Moderate Dysmenorrhea Symptoms: Headache, Cramping  On the POP she has spotting 1-2 x a month, no real cycle. Typically spots for 4-5 days. Still getting cramps, tolerable. She c/o headaches (migraines mostly without aura) around the time she is spotting (1-2 x a month for a couple of days). Headaches are a little worse on the POP compared to OCP's, but still better than prior to pills.  Not currently sexually active.  She has a h/o VAIN II, s/p ablation in 6/17. She had af/u pap and hpv in 1/18 that were negative  Patient's last menstrual period was 02/12/2017.          Sexually active: No.  The current method of family planning is POP(progesterone).    Exercising: No.  The patient does not participate in regular exercise at present. Smoker:  no  Health Maintenance: Pap:  01-12-17 WNL NEG HR HPV History of abnormal Pap:  Yes, H/O VAIN II MMG:  Never Colonoscopy:  Never BMD:   Never TDaP:  Unsure, she will check with her primary Gardasil: N/A   reports that she has never smoked. She has never used smokeless tobacco. She reports that she does not drink alcohol or use drugs. Daughter is 10  Past Medical History:  Diagnosis Date  . Anxiety   . Dysrhythmia    tachycardia with anxiety  . Environmental allergies   . GERD (gastroesophageal reflux disease)   . Hypertension   . Migraine   . Shortness of breath dyspnea    with anxiety  . Thyroid nodule    benign    Past Surgical History:  Procedure Laterality Date  . BIOPSY THYROID    . CERVICAL CONIZATION W/BX N/A 06/02/2016   Procedure: J PLASMA LASER WITH REMOVAL OF VAGINAL DYSPLASIA;   Surgeon: Romualdo Bolk, MD;  Location: [facility];  Service: Gynecology;  Laterality: N/A;  will use J-plasma.  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  J-plasma rep confirmed on 05/26/16.  Kennon Rounds notified  . CESAREAN SECTION    . COLPOSCOPY      Current Outpatient Prescriptions  Medication Sig Dispense Refill  . buPROPion (WELLBUTRIN XL) 150 MG 24 hr tablet Take 150 mg by mouth daily.   0  . busPIRone (BUSPAR) 10 MG tablet   1  . cholecalciferol (VITAMIN D) 1000 units tablet Take 1,000 Units by mouth daily.    . famotidine (PEPCID) 20 MG tablet Take 20 mg by mouth.    . fluticasone (FLONASE) 50 MCG/ACT nasal spray Place into the nose.    . ibuprofen (ADVIL,MOTRIN) 200 MG tablet Take 600 mg by mouth every 6 (six) hours as needed for headache or moderate pain.    . metoprolol (LOPRESSOR) 50 MG tablet Take 50 mg by mouth daily.    . montelukast (SINGULAIR) 10 MG tablet Take 10 mg by mouth at bedtime.   2  . Multiple Vitamin (MULTIVITAMIN) tablet Take 1 tablet by mouth daily.    . norethindrone (MICRONOR,CAMILA,ERRIN) 0.35 MG tablet Take 1 tablet (0.35 mg total) by mouth daily. 3 Package 0  . omeprazole (PRILOSEC OTC) 20 MG tablet Take 20 mg by mouth daily.    . phentermine 37.5  MG capsule Take 37.5 mg by mouth every morning.    . rizatriptan (MAXALT) 10 MG tablet Take 10 mg by mouth as needed for migraine.     . traZODone (DESYREL) 100 MG tablet Take 100-200 mg by mouth at bedtime. sleep    . vitamin E 1000 UNIT capsule Take by mouth.     No current facility-administered medications for this visit.     Family History  Problem Relation Age of Onset  . Hypertension Mother   . Atrial fibrillation Mother   . Migraines Mother   . Migraines Sister   . Migraines Brother   . Dementia Maternal Grandmother   . Leukemia Maternal Grandfather   . Migraines Maternal Grandfather   . Colon cancer Cousin     Review of Systems  Constitutional: Negative.   HENT: Negative.   Eyes: Negative.    Respiratory: Negative.   Cardiovascular: Negative.   Gastrointestinal: Negative.   Endocrine: Negative.   Genitourinary: Negative.   Musculoskeletal: Negative.   Skin: Negative.   Allergic/Immunologic: Negative.   Neurological: Negative.   Psychiatric/Behavioral: Negative.     Exam:   BP 118/70 (BP Location: Right Arm, Patient Position: Sitting, Cuff Size: Normal)   Pulse 68   Resp 16   Ht 5' 3.5" (1.613 m)   Wt 178 lb (80.7 kg)   LMP 02/12/2017   BMI 31.04 kg/m   Weight change: @WEIGHTCHANGE @ Height:   Height: 5' 3.5" (161.3 cm)  Ht Readings from Last 3 Encounters:  02/23/17 5' 3.5" (1.613 m)  01/12/17 5' 3.5" (1.613 m)  06/23/16 5\' 2"  (1.575 m)    General appearance: alert, cooperative and appears stated age Head: Normocephalic, without obvious abnormality, atraumatic Neck: no adenopathy, supple, symmetrical, trachea midline and thyroid normal to inspection and palpation Lungs: clear to auscultation bilaterally Cardiovascular: regular rate and rhythm Breasts: normal appearance, no masses or tenderness Heart: regular rate and rhythm Abdomen: soft, non-tender; bowel sounds normal; no masses,  no organomegaly Extremities: extremities normal, atraumatic, no cyanosis or edema Skin: Skin color, texture, turgor normal. No rashes or lesions Lymph nodes: Cervical, supraclavicular, and axillary nodes normal. No abnormal inguinal nodes palpated Neurologic: Grossly normal   Pelvic: External genitalia:  no lesions              Urethra:  normal appearing urethra with no masses, tenderness or lesions              Bartholins and Skenes: normal                 Vagina: normal appearing vagina with normal color and discharge, no lesions              Cervix: no lesions               Bimanual Exam:  Uterus:  normal size, contour, position, consistency, mobility, non-tender              Adnexa: no mass, fullness, tenderness               Rectovaginal: Confirms               Anus:   normal sphincter tone, no lesions  Chaperone was present for exam.  A:  Well Woman with normal exam  H/O VAIN II, s/p ablation  P:   Recent negative pap and hpv   F/U pap and hpv in 6 and 12 months, then back to yearly paps if normal.   Continue POP's  Discussed breast self exam  Discussed calcium and vit D intake  Screening labs

## 2017-02-24 LAB — VITAMIN D 25 HYDROXY (VIT D DEFICIENCY, FRACTURES): VIT D 25 HYDROXY: 55 ng/mL (ref 30–100)

## 2017-07-17 ENCOUNTER — Ambulatory Visit: Payer: Self-pay | Admitting: Obstetrics and Gynecology

## 2017-08-31 ENCOUNTER — Ambulatory Visit: Payer: Self-pay | Admitting: Obstetrics and Gynecology

## 2017-09-05 ENCOUNTER — Ambulatory Visit (INDEPENDENT_AMBULATORY_CARE_PROVIDER_SITE_OTHER): Payer: BLUE CROSS/BLUE SHIELD | Admitting: Obstetrics and Gynecology

## 2017-09-05 ENCOUNTER — Encounter: Payer: Self-pay | Admitting: Obstetrics and Gynecology

## 2017-09-05 ENCOUNTER — Other Ambulatory Visit (HOSPITAL_COMMUNITY)
Admission: RE | Admit: 2017-09-05 | Discharge: 2017-09-05 | Disposition: A | Discharge status: Home / Self Care | Payer: BLUE CROSS/BLUE SHIELD | Source: Ambulatory Visit | Attending: Obstetrics and Gynecology | Admitting: Obstetrics and Gynecology

## 2017-09-05 VITALS — BP 100/60 | HR 64 | Resp 14 | Wt 166.0 lb

## 2017-09-05 DIAGNOSIS — Z87411 Personal history of vaginal dysplasia: Secondary | ICD-10-CM | POA: Insufficient documentation

## 2017-09-05 DIAGNOSIS — Z124 Encounter for screening for malignant neoplasm of cervix: Secondary | ICD-10-CM | POA: Insufficient documentation

## 2017-09-05 DIAGNOSIS — Z1272 Encounter for screening for malignant neoplasm of vagina: Secondary | ICD-10-CM | POA: Diagnosis not present

## 2017-09-05 NOTE — Progress Notes (Signed)
GYNECOLOGY  VISIT   HPI: 36 y.o.   Single  Caucasian  female   [id number] with Patient's last menstrual period was 08/28/2017.   here for PAP. H/O VAIN II, s/p ablation in 6/17. Negative pap and hpv in 1/18  GYNECOLOGIC HISTORY: Patient's last menstrual period was 08/28/2017. Contraception:OCP Menopausal hormone therapy: none        OB History    Gravida Para Term Preterm AB Living   SAB TAB Ectopic Multiple Live Births   2       1         Patient Active Problem List   Diagnosis Date Noted  . Environmental allergies   . Migraine with aura and without status migrainosus, not intractable 02/15/2016  . Essential hypertension 02/15/2016    Past Medical History:  Diagnosis Date  . Anxiety   . Dysrhythmia    tachycardia with anxiety  . Environmental allergies   . GERD (gastroesophageal reflux disease)   . Hypertension   . Migraine   . Shortness of breath dyspnea    with anxiety  . Thyroid nodule    benign    Past Surgical History:  Procedure Laterality Date  . BIOPSY THYROID    . 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  J-plasma rep confirmed on 05/26/16.  Kennon Rounds notified  . CESAREAN SECTION    . COLPOSCOPY      Current Outpatient Prescriptions  Medication Sig Dispense Refill  . buPROPion (WELLBUTRIN XL) 150 MG 24 hr tablet Take 150 mg by mouth daily.   0  . busPIRone (BUSPAR) 10 MG tablet   1  . cetirizine (ZYRTEC) 10 MG chewable tablet Chew 10 mg by mouth daily.    . cholecalciferol (VITAMIN D) 1000 units tablet Take 1,000 Units by mouth daily.    . famotidine (PEPCID) 20 MG tablet Take 20 mg by mouth.    . fluticasone (FLONASE) 50 MCG/ACT nasal spray Place into the nose.    . ibuprofen (ADVIL,MOTRIN) 200 MG tablet Take 600 mg by mouth every 6  (six) hours as needed for headache or moderate pain.    . metoprolol (LOPRESSOR) 50 MG tablet Take 50 mg by mouth daily.    . montelukast (SINGULAIR) 10 MG tablet Take 10 mg by mouth at bedtime.   2  . Multiple Vitamin (MULTIVITAMIN) tablet Take 1 tablet by mouth daily.    . norethindrone (MICRONOR,CAMILA,ERRIN) 0.35 MG tablet Take 1 tablet (0.35 mg total) by mouth daily. 3 Package 3  . omeprazole (PRILOSEC OTC) 20 MG tablet Take 20 mg by mouth daily.    . phentermine 37.5 MG capsule Take 37.5 mg by mouth every morning.    . rizatriptan (MAXALT) 10 MG tablet Take 10 mg by mouth as needed for migraine.     . traZODone (DESYREL) 100 MG tablet Take 100-200 mg by mouth at bedtime. sleep     No current facility-administered medications for this visit.      ALLERGIES: Patient has no known allergies.  Family History  Problem Relation Age of Onset  . Hypertension Mother   . Atrial fibrillation Mother   . Migraines Mother   . Migraines Sister   . Migraines Brother   . Dementia Maternal Grandmother   .  Leukemia Maternal Grandfather   . Migraines Maternal Grandfather   . Colon cancer Cousin     Social History   Social History  . Marital status: Single    Spouse name: N/A  . Number of children: N/A  . Years of education: N/A   Occupational History  . Not on file.   Social History Main Topics  . Smoking status: Never Smoker  . Smokeless tobacco: Never Used  . Alcohol use No  . Drug use: No  . Sexual activity: Not Currently    Partners: Male    Birth control/ protection: Pill   Other Topics Concern  . Not on file   Social History Narrative  . No narrative on file    Review of Systems  Constitutional: Negative.   HENT: Negative.   Eyes: Negative.   Respiratory: Negative.   Cardiovascular: Negative.   Gastrointestinal: Negative.   Genitourinary: Negative.   Musculoskeletal: Negative.   Skin:       Mole on back  Neurological: Negative.   Endo/Heme/Allergies:  Negative.   Psychiatric/Behavioral:       Anxiety    PHYSICAL EXAMINATION:    BP 100/60 (BP Location: Right Arm, Patient Position: Sitting, Cuff Size: Normal)   Pulse 64   Resp 14   Wt 166 lb (75.3 kg)   LMP 08/28/2017   BMI 28.94 kg/m     General appearance: alert, cooperative and appears stated age  Pelvic: External genitalia:  no lesions              Urethra:  normal appearing urethra with no masses, tenderness or lesions              Bartholins and Skenes: normal                 Vagina: normal appearing vagina with normal color and discharge, no lesions              Cervix: no lesions  Chaperone was present for exam.  ASSESSMENT H/O VAIN II, s/p ablation    PLAN Pap with hpv from cervix and vagina   An After Visit Summary was printed and given to the patient.

## 2017-09-07 LAB — CYTOLOGY - PAP
Diagnosis: NEGATIVE
HPV: NOT DETECTED

## 2017-09-08 ENCOUNTER — Telehealth: Payer: Self-pay

## 2017-09-08 NOTE — Telephone Encounter (Signed)
Spoke with patient. Results given. Patient verbalizes understanding. Aex scheduled for 03/01/2018 at 9:30 am with Dr.Jertson. Patient is agreeable to date and time. 08 recall placed.

## 2017-09-08 NOTE — Telephone Encounter (Signed)
-----   Message from Romualdo Bolk, MD sent at 09/07/2017  5:32 PM EDT ----- Randie Heinz news!! Please let the patient know that her pap and hpv are negative for infection. She should have one more pap in 6 months (at her annual in 3/19), then we can space her pap out to yearly.

## 2017-09-08 NOTE — Telephone Encounter (Signed)
Pt returning your call

## 2017-09-08 NOTE — Telephone Encounter (Signed)
Left message to call Kaitlyn at [phone]. 

## 2017-10-05 DIAGNOSIS — F410 Panic disorder [episodic paroxysmal anxiety] without agoraphobia: Secondary | ICD-10-CM | POA: Insufficient documentation

## 2017-10-17 ENCOUNTER — Ambulatory Visit (INDEPENDENT_AMBULATORY_CARE_PROVIDER_SITE_OTHER): Payer: BLUE CROSS/BLUE SHIELD | Admitting: Certified Nurse Midwife

## 2017-10-17 ENCOUNTER — Encounter: Payer: Self-pay | Admitting: Certified Nurse Midwife

## 2017-10-17 VITALS — BP 94/60 | HR 68 | Resp 16 | Ht 63.5 in | Wt 171.0 lb

## 2017-10-17 DIAGNOSIS — N920 Excessive and frequent menstruation with regular cycle: Secondary | ICD-10-CM | POA: Insufficient documentation

## 2017-10-17 DIAGNOSIS — F419 Anxiety disorder, unspecified: Secondary | ICD-10-CM | POA: Insufficient documentation

## 2017-10-17 DIAGNOSIS — N946 Dysmenorrhea, unspecified: Secondary | ICD-10-CM | POA: Insufficient documentation

## 2017-10-17 DIAGNOSIS — R87619 Unspecified abnormal cytological findings in specimens from cervix uteri: Secondary | ICD-10-CM | POA: Insufficient documentation

## 2017-10-17 DIAGNOSIS — Z113 Encounter for screening for infections with a predominantly sexual mode of transmission: Secondary | ICD-10-CM | POA: Diagnosis not present

## 2017-10-17 DIAGNOSIS — N898 Other specified noninflammatory disorders of vagina: Secondary | ICD-10-CM | POA: Diagnosis not present

## 2017-10-17 NOTE — Patient Instructions (Signed)
Sexually Transmitted Disease A sexually transmitted disease (STD) is a disease or infection that may be passed (transmitted) from person to person, usually during sexual activity. This may happen by way of saliva, semen, blood, vaginal mucus, or urine. Common STDs include:  Gonorrhea.  Chlamydia.  Syphilis.  HIV and AIDS.  Genital herpes.  Hepatitis B and C.  Trichomonas.  Human papillomavirus (HPV).  Pubic lice.  Scabies.  Mites.  Bacterial vaginosis.  What are the causes? An STD may be caused by bacteria, a virus, or parasites. STDs are often transmitted during sexual activity if one person is infected. However, they may also be transmitted through nonsexual means. STDs may be transmitted after:  Sexual intercourse with an infected person.  Sharing sex toys with an infected person.  Sharing needles with an infected person or using unclean piercing or tattoo needles.  Having intimate contact with the genitals, mouth, or rectal areas of an infected person.  Exposure to infected fluids during birth.  What are the signs or symptoms? Different STDs have different symptoms. Some people may not have any symptoms. If symptoms are present, they may include:  Painful or bloody urination.  Pain in the pelvis, abdomen, vagina, anus, throat, or eyes.  A skin rash, itching, or irritation.  Growths, ulcerations, blisters, or sores in the genital and anal areas.  Abnormal vaginal discharge with or without bad odor.  Penile discharge in men.  Fever.  Pain or bleeding during sexual intercourse.  Swollen glands in the groin area.  Yellow skin and eyes (jaundice). This is seen with hepatitis.  Swollen testicles.  Infertility.  Sores and blisters in the mouth.  How is this diagnosed? To make a diagnosis, your health care provider may:  Take a medical history.  Perform a physical exam.  Take a sample of any discharge to examine.  Swab the throat, cervix,  opening to the penis, rectum, or vagina for testing.  Test a sample of your first morning urine.  Perform blood tests.  Perform a Pap test, if this applies.  Perform a colposcopy.  Perform a laparoscopy.  How is this treated? Treatment depends on the STD. Some STDs may be treated but not cured.  Chlamydia, gonorrhea, trichomonas, and syphilis can be cured with antibiotic medicine.  Genital herpes, hepatitis, and HIV can be treated, but not cured, with prescribed medicines. The medicines lessen symptoms.  Genital warts from HPV can be treated with medicine or by freezing, burning (electrocautery), or surgery. Warts may come back.  HPV cannot be cured with medicine or surgery. However, abnormal areas may be removed from the cervix, vagina, or vulva.  If your diagnosis is confirmed, your recent sexual partners need treatment. This is true even if they are symptom-free or have a negative culture or evaluation. They should not have sex until their health care providers say it is okay.  Your health care provider may test you for infection again 3 months after treatment.  How is this prevented? Take these steps to reduce your risk of getting an STD:  Use latex condoms, dental dams, and water-soluble lubricants during sexual activity. Do not use petroleum jelly or oils.  Avoid having multiple sex partners.  Do not have sex with someone who has other sex partners.  Do not have sex with anyone you do not know or who is at high risk for an STD.  Avoid risky sex practices that can break your skin.  Do not have sex if you have open sores  on your mouth or skin.  Avoid drinking too much alcohol or taking illegal drugs. Alcohol and drugs can affect your judgment and put you in a vulnerable position.  Avoid engaging in oral and anal sex acts.  Get vaccinated for HPV and hepatitis. If you have not received these vaccines in the past, talk to your health care provider about whether one or  both might be right for you.  If you are at risk of being infected with HIV, it is recommended that you take a prescription medicine daily to prevent HIV infection. This is called pre-exposure prophylaxis (PrEP). You are considered at risk if: ? You are a man who has sex with other men (MSM). ? You are a heterosexual man or woman and are sexually active with more than one partner. ? You take drugs by injection. ? You are sexually active with a partner who has HIV.  Talk with your health care provider about whether you are at high risk of being infected with HIV. If you choose to begin PrEP, you should first be tested for HIV. You should then be tested every 3 months for as long as you are taking PrEP.  Contact a health care provider if:  See your health care provider.  Tell your sexual partner(s). They should be tested and treated for any STDs.  Do not have sex until your health care provider says it is okay. Get help right away if: Contact your health care provider right away if:  You have severe abdominal pain.  You are a man and notice swelling or pain in your testicles.  You are a woman and notice swelling or pain in your vagina.  This information is not intended to replace advice given to you by your health care provider. Make sure you discuss any questions you have with your health care provider. Document Released: 02/25/2003 Document Revised: 06/24/2016 Document Reviewed: 06/25/2013 Elsevier Interactive Patient Education  2018 Elsevier Inc[employer] Vaginitis Vaginitis is an inflammation of the vagina. It can happen when the normal bacteria and yeast in the vagina grow too much. There are different types. Treatment will depend on the type you have. Follow these instructions at home:  Take all medicines as told by your doctor.  Keep your vagina area clean and dry. Avoid soap. Rinse the area with water.  Avoid washing and cleaning out the vagina (douching).  Do not use tampons or  have sex (intercourse) until your treatment is done.  Wipe from front to back after going to the restroom.  Wear cotton underwear.  Avoid wearing underwear while you sleep until your vaginitis is gone.  Avoid tight pants. Avoid underwear or nylons without a cotton panel.  Take off wet clothing (such as a bathing suit) as soon as you can.  Use mild, unscented products. Avoid fabric softeners and scented: ? Feminine sprays. ? Laundry detergents. ? Tampons. ? Soaps or bubble baths.  Practice safe sex and use condoms. Get help right away if:  You have belly (abdominal) pain.  You have a fever or lasting symptoms for more than 2-3 days.  You have a fever and your symptoms suddenly get worse. This information is not intended to replace advice given to you by your health care provider. Make sure you discuss any questions you have with your health care provider. Document Released: 03/03/2009 Document Revised: 05/12/2016 Document Reviewed: 05/17/2012 Elsevier Interactive Patient Education  2017 Elsevier Inc.  

## 2017-10-17 NOTE — Progress Notes (Signed)
36 y.o. Single Caucasian female [phone] here with complaint of vaginal symptoms of itching, burning, and increase discharge. Describes discharge as white at times and no odor. Tried OTC cream with no change in itching.. Onset of symptoms 3-4 days ago. Has not been sexually active in past 3 years and now sexually active for the past month with new partner. No condom use. Partner was tested, but no results. Started using new soap, when symptoms started. Questionable STD concerns. Urinary burning when touches the skin. . Contraception is POP, consistent use. Desires STD screening. No other health issues today. Continues with MD for Psychiatric medications, panic attacks are better.   ROS Pertinent to above  O:Healthy female WDWN Affect: normal, orientation x 3  Exam:Skin: warm and dry Abdomen: Soft, non tender  inguinal Lymph nodes: no enlargement or tenderness Pelvic exam: External genital: normal female, no lesions BUS: negative Vagina: watery clear non odorous discharge noted.  Affirm taken Cervix: normal, non tender, no CMT Uterus: normal, non tender Adnexa:normal, non tender, no masses or fullness noted  A:Normal pelvic exam Contraception POP R/O vaginal infection STD screening   P:Discussed findings of normal pelvic exam and etiology. Discussed Aveeno or baking soda sitz bath for comfort, until lab results are back. Avoid moist clothes or pads for extended period of time. If working out in gym clothes   for long periods of time change underwear. Coconut Oil use for skin protection prior to activity can be used to external skin for protection or itching. Discussed condom use for STD protection. Labs: GC,Chlamydia, HIV,RPR, Hep C, Vaginal screen  Rv prn

## 2017-10-18 LAB — HEPATITIS C ANTIBODY

## 2017-10-18 LAB — GC/CHLAMYDIA PROBE AMP
CHLAMYDIA, DNA PROBE: NEGATIVE
NEISSERIA GONORRHOEAE BY PCR: NEGATIVE

## 2017-10-18 LAB — HIV ANTIBODY (ROUTINE TESTING W REFLEX): HIV Screen 4th Generation wRfx: NONREACTIVE

## 2017-10-18 LAB — VAGINITIS/VAGINOSIS, DNA PROBE
CANDIDA SPECIES: NEGATIVE
GARDNERELLA VAGINALIS: NEGATIVE
TRICHOMONAS VAG: NEGATIVE

## 2017-10-18 LAB — RPR: RPR Ser Ql: NONREACTIVE

## 2018-02-16 HISTORY — PX: MOLE REMOVAL: SHX2046

## 2018-03-01 ENCOUNTER — Encounter: Payer: Self-pay | Admitting: Obstetrics and Gynecology

## 2018-03-01 ENCOUNTER — Other Ambulatory Visit: Payer: Self-pay

## 2018-03-01 ENCOUNTER — Other Ambulatory Visit (HOSPITAL_COMMUNITY)
Admission: RE | Admit: 2018-03-01 | Discharge: 2018-03-01 | Disposition: A | Discharge status: Home / Self Care | Payer: BLUE CROSS/BLUE SHIELD | Source: Ambulatory Visit | Attending: Obstetrics and Gynecology | Admitting: Obstetrics and Gynecology

## 2018-03-01 ENCOUNTER — Ambulatory Visit: Payer: BLUE CROSS/BLUE SHIELD | Admitting: Obstetrics and Gynecology

## 2018-03-01 VITALS — BP 102/60 | HR 64 | Resp 14 | Ht 63.5 in | Wt 173.0 lb

## 2018-03-01 DIAGNOSIS — Z7185 Encounter for immunization safety counseling: Secondary | ICD-10-CM

## 2018-03-01 DIAGNOSIS — Z124 Encounter for screening for malignant neoplasm of cervix: Secondary | ICD-10-CM | POA: Diagnosis not present

## 2018-03-01 DIAGNOSIS — Z23 Encounter for immunization: Secondary | ICD-10-CM

## 2018-03-01 DIAGNOSIS — F419 Anxiety disorder, unspecified: Secondary | ICD-10-CM | POA: Insufficient documentation

## 2018-03-01 DIAGNOSIS — Z01419 Encounter for gynecological examination (general) (routine) without abnormal findings: Secondary | ICD-10-CM

## 2018-03-01 DIAGNOSIS — Z7189 Other specified counseling: Secondary | ICD-10-CM | POA: Diagnosis not present

## 2018-03-01 DIAGNOSIS — I10 Essential (primary) hypertension: Secondary | ICD-10-CM | POA: Diagnosis not present

## 2018-03-01 DIAGNOSIS — Z0000 Encounter for general adult medical examination without abnormal findings: Secondary | ICD-10-CM

## 2018-03-01 DIAGNOSIS — Z3041 Encounter for surveillance of contraceptive pills: Secondary | ICD-10-CM | POA: Diagnosis not present

## 2018-03-01 DIAGNOSIS — G43909 Migraine, unspecified, not intractable, without status migrainosus: Secondary | ICD-10-CM | POA: Diagnosis not present

## 2018-03-01 DIAGNOSIS — K219 Gastro-esophageal reflux disease without esophagitis: Secondary | ICD-10-CM | POA: Insufficient documentation

## 2018-03-01 DIAGNOSIS — Z1272 Encounter for screening for malignant neoplasm of vagina: Secondary | ICD-10-CM | POA: Diagnosis not present

## 2018-03-01 MED ORDER — NORETHINDRONE 0.35 MG PO TABS: 1.0000 | ORAL_TABLET | Freq: Every day | ORAL | 3 refills | Status: DC

## 2018-03-01 NOTE — Progress Notes (Signed)
37 y.o. [id number] SingleCaucasianF here for annual exam.   She has a h/o VAIN II, s/p ablation in 6/17. Negative pap and hpv in 1/18 and again in 9/18.  She is on POP, cycles are mostly every month. Cramps are tolerable. Sexually active, same partner x 9 months. She had negative STD testing in the fall Period Duration (Days): 4-7 days  Period Pattern: (!) Irregular Menstrual Flow: Light Menstrual Control: Panty liner, Thin pad Menstrual Control Change Freq (Hours): changes pad every 5-6 hours  Dysmenorrhea: (!) Moderate Dysmenorrhea Symptoms: Headache  She has anxiety, controlled on medication, managed by behavioral health.   Patient's last menstrual period was 03/01/2018.          Sexually active: Yes.    The current method of family planning is POP (progesterone).    Exercising: No.  The patient does not participate in regular exercise at present. Smoker:  no  Health Maintenance: Pap:  09-05-17 WNL 01-12-17 WNL  History of abnormal Pap:  Yes  02-15-16 ASCUS + HPV colposcopy 03-01-16  CIN-I VAIN II ECC neg  TDaP:  unsure Gardasil: no, discussed, she would like to start it.     reports that  has never smoked. she has never used smokeless tobacco. She reports that she does not drink alcohol or use drugs. Daughter is 49, doing well. She is a Equities trader.   Past Medical History:  Diagnosis Date  . Anxiety   . Dysrhythmia    tachycardia with anxiety  . Environmental allergies   . GERD (gastroesophageal reflux disease)   . Hypertension   . Migraine   . Shortness of breath dyspnea    with anxiety  . Thyroid nodule    benign    Past Surgical History:  Procedure Laterality Date  . BIOPSY THYROID    . 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  J-plasma rep confirmed on 05/26/16.   Kennon Rounds notified  . CESAREAN SECTION    . COLPOSCOPY    . MOLE REMOVAL  02/2018   removed from back   . WISDOM TOOTH EXTRACTION      Current Outpatient Medications  Medication Sig Dispense Refill  . buPROPion (WELLBUTRIN XL) 150 MG 24 hr tablet Take 150 mg by mouth daily.   0  . busPIRone (BUSPAR) 15 MG tablet Take 15 mg by mouth 2 (two) times daily.     . cetirizine (ZYRTEC) 10 MG tablet Take 10 mg by mouth daily.    . cholecalciferol (VITAMIN D) 1000 units tablet Take 1,000 Units by mouth daily.    Marland Kitchen EPINEPHrine (EPIPEN 2-PAK) 0.3 mg/0.3 mL IJ SOAJ injection Inject into the muscle once.    . famotidine (PEPCID) 20 MG tablet Take 20 mg by mouth.    . fluticasone (FLONASE) 50 MCG/ACT nasal spray Place into the nose.    . metoprolol (LOPRESSOR) 50 MG tablet Take 50 mg by mouth daily.    . montelukast (SINGULAIR) 10 MG tablet Take 10 mg by mouth at bedtime.   2  . Multiple Vitamin (MULTIVITAMIN) tablet Take 1 tablet by mouth daily.    . norethindrone (MICRONOR,CAMILA,ERRIN) 0.35 MG tablet Take 1 tablet (0.35 mg total) by mouth daily. 3 Package 3  . traZODone (DESYREL) 100 MG tablet Take 100-200 mg by mouth at bedtime. sleep    .  ibuprofen (ADVIL,MOTRIN) 200 MG tablet Take 600 mg by mouth every 6 (six) hours as needed for headache or moderate pain.     No current facility-administered medications for this visit.     Family History  Problem Relation Age of Onset  . Hypertension Mother   . Atrial fibrillation Mother   . Migraines Mother   . Migraines Sister   . Migraines Brother   . Dementia Maternal Grandmother   . Leukemia Maternal Grandfather   . Migraines Maternal Grandfather   . Colon cancer Cousin     Review of Systems  Constitutional: Negative.   HENT: Negative.   Eyes: Negative.   Respiratory: Negative.   Cardiovascular: Negative.   Gastrointestinal: Negative.   Endocrine: Negative.   Genitourinary: Negative.   Musculoskeletal: Negative.   Skin: Negative.    Allergic/Immunologic: Negative.   Neurological: Positive for headaches.  Psychiatric/Behavioral: Negative.        Anxiety Memory problems     Exam:   BP 102/60 (BP Location: Right Arm, Patient Position: Sitting, Cuff Size: Normal)   Pulse 64   Resp 14   Ht 5' 3.5" (1.613 m)   Wt 173 lb (78.5 kg)   LMP 03/01/2018   BMI 30.16 kg/m   Weight change: @WEIGHTCHANGE @ Height:   Height: 5' 3.5" (161.3 cm)  Ht Readings from Last 3 Encounters:  03/01/18 5' 3.5" (1.613 m)  10/17/17 5' 3.5" (1.613 m)  02/23/17 5' 3.5" (1.613 m)    General appearance: alert, cooperative and appears stated age Head: Normocephalic, without obvious abnormality, atraumatic Neck: no adenopathy, supple, symmetrical, trachea midline and thyroid normal to inspection and palpation Lungs: clear to auscultation bilaterally Cardiovascular: regular rate and rhythm Breasts: normal appearance, no masses or tenderness Abdomen: soft, non-tender; non distended,  no masses,  no organomegaly Extremities: extremities normal, atraumatic, no cyanosis or edema Skin: Skin color, texture, turgor normal. No rashes or lesions Lymph nodes: Cervical, supraclavicular, and axillary nodes normal. No abnormal inguinal nodes palpated Neurologic: Grossly normal   Pelvic: External genitalia:  no lesions              Urethra:  normal appearing urethra with no masses, tenderness or lesions              Bartholins and Skenes: normal                 Vagina: normal appearing vagina with normal color and discharge, no lesions              Cervix: no lesions               Bimanual Exam:  Uterus:  normal size, contour, position, consistency, mobility, non-tender              Adnexa: no mass, fullness, tenderness               Rectovaginal: Confirms               Anus:  normal sphincter tone, no lesions  Chaperone was present for exam.  A:  Well Woman with normal exam  H/O VAIN  On POP  P:   Pap with hpv (cervix and vagina)  Continue  POP  Discussed breast self exam  Discussed calcium and vit D intake  TDAP and gardasil today  Screening labs

## 2018-03-01 NOTE — Patient Instructions (Signed)
EXERCISE AND DIET:  We recommended that you start or continue a regular exercise program for good health. Regular exercise means any activity that makes your heart beat faster and makes you sweat.  We recommend exercising at least 30 minutes per day at least 3 days a week, preferably 4 or 5.  We also recommend a diet low in fat and sugar.  Inactivity, poor dietary choices and obesity can cause diabetes, heart attack, stroke, and kidney damage, among others.    ALCOHOL AND SMOKING:  Women should limit their alcohol intake to no more than 7 drinks/beers/glasses of wine (combined, not each!) per week. Moderation of alcohol intake to this level decreases your risk of breast cancer and liver damage. And of course, no recreational drugs are part of a healthy lifestyle.  And absolutely no smoking or even second hand smoke. Most people know smoking can cause heart and lung diseases, but did you know it also contributes to weakening of your bones? Aging of your skin?  Yellowing of your teeth and nails?  CALCIUM AND VITAMIN D:  Adequate intake of calcium and Vitamin D are recommended.  The recommendations for exact amounts of these supplements seem to change often, but generally speaking 600 mg of calcium (either carbonate or citrate) and 800 units of Vitamin D per day seems prudent. Certain women may benefit from higher intake of Vitamin D.  If you are among these women, your doctor will have told you during your visit.    PAP SMEARS:  Pap smears, to check for cervical cancer or precancers,  have traditionally been done yearly, although recent scientific advances have shown that most women can have pap smears less often.  However, every woman still should have a physical exam from her gynecologist every year. It will include a breast check, inspection of the vulva and vagina to check for abnormal growths or skin changes, a visual exam of the cervix, and then an exam to evaluate the size and shape of the uterus and  ovaries.  And after 37 years of age, a rectal exam is indicated to check for rectal cancers. We will also provide age appropriate advice regarding health maintenance, like when you should have certain vaccines, screening for sexually transmitted diseases, bone density testing, colonoscopy, mammograms, etc.   MAMMOGRAMS:  All women over 40 years old should have a yearly mammogram. Many facilities now offer a "3D" mammogram, which may cost around $50 extra out of pocket. If possible,  we recommend you accept the option to have the 3D mammogram performed.  It both reduces the number of women who will be called back for extra views which then turn out to be normal, and it is better than the routine mammogram at detecting truly abnormal areas.    COLONOSCOPY:  Colonoscopy to screen for colon cancer is recommended for all women at age 50.  We know, you hate the idea of the prep.  We agree, BUT, having colon cancer and not knowing it is worse!!  Colon cancer so often starts as a polyp that can be seen and removed at colonscopy, which can quite literally save your life!  And if your first colonoscopy is normal and you have no family history of colon cancer, most women don't have to have it again for 10 years.  Once every ten years, you can do something that may end up saving your life, right?  We will be happy to help you get it scheduled when you are ready.    Be sure to check your insurance coverage so you understand how much it will cost.  It may be covered as a preventative service at no cost, but you should check your particular policy.      Breast Self-Awareness Breast self-awareness means being familiar with how your breasts look and feel. It involves checking your breasts regularly and reporting any changes to your health care provider. Practicing breast self-awareness is important. A change in your breasts can be a sign of a serious medical problem. Being familiar with how your breasts look and feel allows  you to find any problems early, when treatment is more likely to be successful. All women should practice breast self-awareness, including women who have had breast implants. How to do a breast self-exam One way to learn what is normal for your breasts and whether your breasts are changing is to do a breast self-exam. To do a breast self-exam: Look for Changes  1. Remove all the clothing above your waist. 2. Stand in front of a mirror in a room with good lighting. 3. Put your hands on your hips. 4. Push your hands firmly downward. 5. Compare your breasts in the mirror. Look for differences between them (asymmetry), such as: ? Differences in shape. ? Differences in size. ? Puckers, dips, and bumps in one breast and not the other. 6. Look at each breast for changes in your skin, such as: ? Redness. ? Scaly areas. 7. Look for changes in your nipples, such as: ? Discharge. ? Bleeding. ? Dimpling. ? Redness. ? A change in position. Feel for Changes  Carefully feel your breasts for lumps and changes. It is best to do this while lying on your back on the floor and again while sitting or standing in the shower or tub with soapy water on your skin. Feel each breast in the following way:  Place the arm on the side of the breast you are examining above your head.  Feel your breast with the other hand.  Start in the nipple area and make  inch (2 cm) overlapping circles to feel your breast. Use the pads of your three middle fingers to do this. Apply light pressure, then medium pressure, then firm pressure. The light pressure will allow you to feel the tissue closest to the skin. The medium pressure will allow you to feel the tissue that is a little deeper. The firm pressure will allow you to feel the tissue close to the ribs.  Continue the overlapping circles, moving downward over the breast until you feel your ribs below your breast.  Move one finger-width toward the center of the body.  Continue to use the  inch (2 cm) overlapping circles to feel your breast as you move slowly up toward your collarbone.  Continue the up and down exam using all three pressures until you reach your armpit.  Write Down What You Find  Write down what is normal for each breast and any changes that you find. Keep a written record with breast changes or normal findings for each breast. By writing this information down, you do not need to depend only on memory for size, tenderness, or location. Write down where you are in your menstrual cycle, if you are still menstruating. If you are having trouble noticing differences in your breasts, do not get discouraged. With time you will become more familiar with the variations in your breasts and more comfortable with the exam. How often should I examine my breasts? Examine   your breasts every month. If you are breastfeeding, the best time to examine your breasts is after a feeding or after using a breast pump. If you menstruate, the best time to examine your breasts is 5-7 days after your period is over. During your period, your breasts are lumpier, and it may be more difficult to notice changes. When should I see my health care provider? See your health care provider if you notice:  A change in shape or size of your breasts or nipples.  A change in the skin of your breast or nipples, such as a reddened or scaly area.  Unusual discharge from your nipples.  A lump or thick area that was not there before.  Pain in your breasts.  Anything that concerns you.  This information is not intended to replace advice given to you by your health care provider. Make sure you discuss any questions you have with your health care provider. Document Released: 12/05/2005 Document Revised: 05/12/2016 Document Reviewed: 10/25/2015 Elsevier Interactive Patient Education  2018 [employer] Inc[employer]  

## 2018-03-02 LAB — COMPREHENSIVE METABOLIC PANEL
ALT: 22 IU/L (ref 0–32)
AST: 18 IU/L (ref 0–40)
Albumin/Globulin Ratio: 2.1 (ref 1.2–2.2)
Albumin: 4.4 g/dL (ref 3.5–5.5)
Alkaline Phosphatase: 63 IU/L (ref 39–117)
BUN/Creatinine Ratio: 13 (ref 9–23)
BUN: 13 mg/dL (ref 6–20)
Bilirubin Total: 0.5 mg/dL (ref 0.0–1.2)
CALCIUM: 9.2 mg/dL (ref 8.7–10.2)
CO2: 27 mmol/L (ref 20–29)
CREATININE: 1.03 mg/dL — AB (ref 0.57–1.00)
Chloride: 103 mmol/L (ref 96–106)
GFR calc Af Amer: 81 mL/min/{1.73_m2} (ref >59)
GFR, EST NON AFRICAN AMERICAN: 70 mL/min/{1.73_m2} (ref >59)
GLOBULIN, TOTAL: 2.1 g/dL (ref 1.5–4.5)
Glucose: 94 mg/dL (ref 65–99)
Potassium: 4.7 mmol/L (ref 3.5–5.2)
Sodium: 140 mmol/L (ref 134–144)
Total Protein: 6.5 g/dL (ref 6.0–8.5)

## 2018-03-02 LAB — LIPID PANEL
CHOL/HDL RATIO: 5.2 ratio — AB (ref 0.0–4.4)
Cholesterol, Total: 173 mg/dL (ref 100–199)
HDL: 33 mg/dL — ABNORMAL LOW (ref >39)
LDL CALC: 102 mg/dL — AB (ref 0–99)
TRIGLYCERIDES: 189 mg/dL — AB (ref 0–149)
VLDL CHOLESTEROL CAL: 38 mg/dL (ref 5–40)

## 2018-03-02 LAB — CBC
HEMATOCRIT: 40.2 % (ref 34.0–46.6)
Hemoglobin: 13.6 g/dL (ref 11.1–15.9)
MCH: 29.6 pg (ref 26.6–33.0)
MCHC: 33.8 g/dL (ref 31.5–35.7)
MCV: 87 fL (ref 79–97)
Platelets: 267 10*3/uL (ref 150–379)
RBC: 4.6 x10E6/uL (ref 3.77–5.28)
RDW: 13.7 % (ref 12.3–15.4)
WBC: 8.6 10*3/uL (ref 3.4–10.8)

## 2018-03-05 LAB — CYTOLOGY - PAP
Diagnosis: NEGATIVE
HPV: DETECTED — AB

## 2018-03-13 ENCOUNTER — Other Ambulatory Visit: Payer: Self-pay | Admitting: *Deleted

## 2018-03-13 DIAGNOSIS — R87810 Cervical high risk human papillomavirus (HPV) DNA test positive: Secondary | ICD-10-CM

## 2018-03-15 ENCOUNTER — Ambulatory Visit: Payer: BLUE CROSS/BLUE SHIELD | Admitting: Obstetrics and Gynecology

## 2018-03-15 ENCOUNTER — Other Ambulatory Visit: Payer: Self-pay

## 2018-03-15 ENCOUNTER — Encounter: Payer: Self-pay | Admitting: Obstetrics and Gynecology

## 2018-03-15 VITALS — BP 122/60 | HR 60 | Ht 63.5 in | Wt 173.0 lb

## 2018-03-15 DIAGNOSIS — R87810 Cervical high risk human papillomavirus (HPV) DNA test positive: Secondary | ICD-10-CM

## 2018-03-15 DIAGNOSIS — Z01812 Encounter for preprocedural laboratory examination: Secondary | ICD-10-CM

## 2018-03-15 DIAGNOSIS — Z87411 Personal history of vaginal dysplasia: Secondary | ICD-10-CM

## 2018-03-15 LAB — POCT URINE PREGNANCY: Preg Test, Ur: NEGATIVE

## 2018-03-15 NOTE — Progress Notes (Signed)
GYNECOLOGY  VISIT   HPI: 37 y.o.   Single  Caucasian  female   [phone] with Patient's last menstrual period was 03/01/2018.   here for   Colposcopy. She has a h/o VAIN II, s/p ablation in 6/17. Negative pap and hpv in 1/18 and again in 9/18. 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   GYNECOLOGIC HISTORY: Patient's last menstrual period was 03/01/2018. Contraception: OCP  Menopausal hormone therapy: none         OB History    Gravida  3   Para  1   Term  1   Preterm      AB  2   Living  1     SAB  2   TAB      Ectopic      Multiple      Live Births  1              Patient Active Problem List   Diagnosis Date Noted  . Abnormal Pap smear of cervix 10/17/2017  . Anxiety 10/17/2017  . Dysmenorrhea 10/17/2017  . Menorrhagia 10/17/2017  . Panic attacks 10/05/2017  . Environmental allergies   . Migraines 02/15/2016  . Hypertension 02/15/2016  . GAD (generalized anxiety disorder) 06/04/2015  . 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 Insomnia 07/17/2014  . Fatigue 06/05/2014  . Memory loss 06/05/2014  . Visual disturbance 06/05/2014    Past Medical History:  Diagnosis Date  . Anxiety   . Dysrhythmia    tachycardia with anxiety  . Environmental allergies   . GERD (gastroesophageal reflux disease)   . Hypertension   . Migraine   . Shortness of breath dyspnea    with anxiety  . Thyroid nodule    benign    Past Surgical History:  Procedure Laterality Date  . BIOPSY THYROID    . 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  J-plasma rep confirmed on 05/26/16.  Kennon Rounds notified  . CESAREAN SECTION    . COLPOSCOPY    . MOLE REMOVAL  02/2018   removed from back   . WISDOM TOOTH EXTRACTION      Current  Outpatient Medications  Medication Sig Dispense Refill  . buPROPion (WELLBUTRIN XL) 150 MG 24 hr tablet Take 150 mg by mouth daily.   0  . busPIRone (BUSPAR) 15 MG tablet Take 15 mg by mouth 2 (two) times daily.     . cetirizine (ZYRTEC) 10 MG tablet Take 10 mg by mouth daily.    . cholecalciferol (VITAMIN D) 1000 units tablet Take 1,000 Units by mouth daily.    Marland Kitchen EPINEPHrine (EPIPEN 2-PAK) 0.3 mg/0.3 mL IJ SOAJ injection Inject into the muscle once.    . famotidine (PEPCID) 20 MG tablet Take 20 mg by mouth.    Marland Kitchen ibuprofen (ADVIL,MOTRIN) 200 MG tablet Take 600 mg by mouth every 6 (six) hours as needed for headache or moderate pain.    . metoprolol (LOPRESSOR) 50 MG tablet Take 50 mg by mouth daily.    . montelukast (SINGULAIR) 10 MG tablet Take 10 mg by mouth at bedtime.   2  . Multiple Vitamin (MULTIVITAMIN) tablet Take 1 tablet by mouth daily.    . norethindrone (MICRONOR,CAMILA,ERRIN) 0.35 MG tablet Take 1  tablet (0.35 mg total) by mouth daily. 3 Package 3  . traZODone (DESYREL) 100 MG tablet Take 100-200 mg by mouth at bedtime. sleep    . fluticasone (FLONASE) 50 MCG/ACT nasal spray Place into the nose.     No current facility-administered medications for this visit.      ALLERGIES: Arlice ColtMustard [allyl isothiocyanate]; Peanut-containing drug products; Shellfish allergy; and Soy allergy  Family History  Problem Relation Age of Onset  . Hypertension Mother   . Atrial fibrillation Mother   . Migraines Mother   . Migraines Sister   . Migraines Brother   . Dementia Maternal Grandmother   . Leukemia Maternal Grandfather   . Migraines Maternal Grandfather   . Colon cancer Cousin     Social History   Socioeconomic History  . Marital status: Single    Spouse name: Not on file  . Number of children: Not on file  . Years of education: Not on file  . Highest education level: Not on file  Occupational History  . Not on file  Social Needs  . Financial resource strain: Not on file  .  Food insecurity:    Worry: Not on file    Inability: Not on file  . Transportation needs:    Medical: Not on file    Non-medical: Not on file  Tobacco Use  . Smoking status: Never Smoker  . Smokeless tobacco: Never Used  Substance and Sexual Activity  . Alcohol use: No    Alcohol/week: 0.0 oz  . Drug use: No  . Sexual activity: Yes    Partners: Male    Birth control/protection: Pill  Lifestyle  . Physical activity:    Days per week: Not on file    Minutes per session: Not on file  . Stress: Not on file  Relationships  . Social connections:    Talks on phone: Not on file    Gets together: Not on file    Attends religious service: Not on file    Active member of club or organization: Not on file    Attends meetings of clubs or organizations: Not on file    Relationship status: Not on file  . Intimate partner violence:    Fear of current or ex partner: Not on file    Emotionally abused: Not on file    Physically abused: Not on file    Forced sexual activity: Not on file  Other Topics Concern  . Not on file  Social History Narrative  . Not on file    Review of Systems  Constitutional: Negative.   HENT: Negative.   Eyes: Negative.   Respiratory: Negative.   Cardiovascular: Negative.   Gastrointestinal: Negative.   Genitourinary: Negative.   Musculoskeletal: Negative.   Skin: Negative.   Neurological: Negative.   Endo/Heme/Allergies:       Craving sweets  Psychiatric/Behavioral: The patient is nervous/anxious.     PHYSICAL EXAMINATION:    BP 122/60   Pulse 60   Ht 5' 3.5" (1.613 m)   Wt 173 lb (78.5 kg)   LMP 03/01/2018   BMI 30.16 kg/m     General appearance: alert, cooperative and appears stated age  Pelvic: External genitalia:  no lesions              Urethra:  normal appearing urethra with no masses, tenderness or lesions              Bartholins and Skenes: normal  Vagina: normal appearing vagina with normal color and discharge, no  lesions              Cervix: no lesions  Colposcopy: Unsatisfactory, no aceto-white changes, no changes on the cervix or entire vagina with application of lugols. As much lugols as could be wiped away was.   (the patient has had allergy testing and is allergic to shellfish, she states she has had a fish allergy for a long time, has never had any issues with lugols solution previously. Counseled as to risks). She knows to call with any signs of an allergic reaction, she does have an epi pen.   Chaperone was present for exam.  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   An After Visit Summary was printed and given to the patient.

## 2018-03-15 NOTE — Patient Instructions (Signed)

## 2018-05-01 ENCOUNTER — Ambulatory Visit (INDEPENDENT_AMBULATORY_CARE_PROVIDER_SITE_OTHER): Payer: BLUE CROSS/BLUE SHIELD | Admitting: *Deleted

## 2018-05-01 ENCOUNTER — Other Ambulatory Visit: Payer: Self-pay

## 2018-05-01 VITALS — BP 108/60 | HR 72 | Resp 12 | Ht 63.0 in | Wt 172.4 lb

## 2018-05-01 DIAGNOSIS — Z23 Encounter for immunization: Secondary | ICD-10-CM

## 2018-05-01 NOTE — Progress Notes (Signed)
Patient in today for second Gardasil injection.   Contraception: POP LMP: on continuous POP Last AEX: 03/01/18 with Dr. Oscar La   Injection given in right deltoid. Patient tolerated shot well.   Patient informed next injection due in about four months. (September)   Advised patient, if not on birth control, to return for next injection with cycle.   Routed to provider for final review.  Encounter closed.

## 2018-06-19 ENCOUNTER — Encounter: Payer: Self-pay | Admitting: Obstetrics and Gynecology

## 2018-06-19 ENCOUNTER — Other Ambulatory Visit: Payer: Self-pay

## 2018-06-19 ENCOUNTER — Ambulatory Visit: Payer: BLUE CROSS/BLUE SHIELD | Admitting: Obstetrics and Gynecology

## 2018-06-19 ENCOUNTER — Telehealth: Payer: Self-pay | Admitting: Obstetrics and Gynecology

## 2018-06-19 VITALS — BP 119/80 | HR 64 | Wt 170.2 lb

## 2018-06-19 DIAGNOSIS — R5383 Other fatigue: Secondary | ICD-10-CM | POA: Diagnosis not present

## 2018-06-19 DIAGNOSIS — N926 Irregular menstruation, unspecified: Secondary | ICD-10-CM

## 2018-06-19 LAB — POCT URINE PREGNANCY: Preg Test, Ur: NEGATIVE

## 2018-06-19 NOTE — Telephone Encounter (Signed)
Patient is 3 weeks late for period with cramping and spotting. Would like to come in for pregnancy test.

## 2018-06-19 NOTE — Progress Notes (Addendum)
GYNECOLOGY  VISIT   HPI: 37 y.o.   Single  Caucasian  female   Hannah Hodge with No LMP recorded. (Menstrual status: Oral contraceptives).   here for late cycle. She is on POP for contraception and typically has a cycle every month. She doesn't track them. Typically bleeds for 4-7 days and changes her pad every 5-6 hours.   LMP 05/02/18. She has never gone 6 weeks without a cycle previously She missed 1 pill 2-3 wks ago, never took a make up pill. She was sexually active the following week. UPT negative 1.5 wks ago. Reports cramping and spotting on 7/1, denies pain or bleeding today.   She does c/o fatigue, no other thyroid c/o.   GYNECOLOGIC HISTORY: No LMP recorded. (Menstrual status: Oral contraceptives). Contraception: Micronor        OB History    Gravida  3   Para  1   Term  1   Preterm      AB  2   Living  1     SAB  2   TAB      Ectopic      Multiple      Live Births  1              Patient Active Problem List   Diagnosis Date Noted  . Abnormal Pap smear of cervix 10/17/2017  . Anxiety 10/17/2017  . Dysmenorrhea 10/17/2017  . Menorrhagia 10/17/2017  . Panic attacks 10/05/2017  . Environmental allergies   . Migraines 02/15/2016  . Hypertension 02/15/2016  . GAD (generalized anxiety disorder) 06/04/2015  . 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 Insomnia 07/17/2014  . Fatigue 06/05/2014  . Memory loss 06/05/2014  . Visual disturbance 06/05/2014    Past Medical History:  Diagnosis Date  . Anxiety   . Dysrhythmia    tachycardia with anxiety  . Environmental allergies   . GERD (gastroesophageal reflux disease)   . Hypertension   . Migraine   . Shortness of breath dyspnea    with anxiety  . Thyroid nodule    benign    Past Surgical History:  Procedure Laterality Date  . BIOPSY THYROID    . CERVICAL CONIZATION W/BX N/A 06/02/2016   Procedure: J PLASMA LASER WITH REMOVAL OF VAGINAL DYSPLASIA;   Surgeon: Romualdo Bolk, MD;  Location: [facility];  Service: Gynecology;  Laterality: N/A;  will use J-plasma.  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  J-plasma rep confirmed on 05/26/16.  Kennon Rounds notified  . CESAREAN SECTION    . COLPOSCOPY    . MOLE REMOVAL  02/2018   removed from back   . WISDOM TOOTH EXTRACTION      Current Outpatient Medications  Medication Sig Dispense Refill  . buPROPion (WELLBUTRIN XL) 150 MG 24 hr tablet Take 150 mg by mouth daily.   0  . busPIRone (BUSPAR) 15 MG tablet Take 15 mg by mouth 2 (two) times daily.     . cetirizine (ZYRTEC) 10 MG tablet Take 10 mg by mouth daily.    . cholecalciferol (VITAMIN D) 1000 units tablet Take 1,000 Units by mouth daily.    Marland Kitchen EPINEPHrine (EPIPEN 2-PAK) 0.3 mg/0.3 mL IJ SOAJ injection Inject into the muscle once.    . famotidine (PEPCID) 20 MG tablet Take 20 mg by mouth.    . fluticasone (FLONASE) 50 MCG/ACT nasal spray Place into the nose.    . ibuprofen (ADVIL,MOTRIN) 200 MG tablet Take 600 mg  by mouth every 6 (six) hours as needed for headache or moderate pain.    Marland Kitchen loratadine (CLARITIN) 10 MG tablet Take by mouth.    . metoprolol (LOPRESSOR) 50 MG tablet Take 50 mg by mouth daily.    . montelukast (SINGULAIR) 10 MG tablet Take 10 mg by mouth at bedtime.   2  . Multiple Vitamin (MULTIVITAMIN) tablet Take 1 tablet by mouth daily.    . norethindrone (MICRONOR,CAMILA,ERRIN) 0.35 MG tablet Take 1 tablet (0.35 mg total) by mouth daily. 3 Package 3  . traZODone (DESYREL) 100 MG tablet Take 100-200 mg by mouth at bedtime. sleep     No current facility-administered medications for this visit.      ALLERGIES: Arlice Colt isothiocyanate]; Peanut-containing drug products; Shellfish allergy; and Soy allergy  Family History  Problem Relation Age of Onset  . Hypertension Mother   . Atrial fibrillation Mother   . Migraines Mother   . Migraines Sister   . Migraines Brother   . Dementia Maternal Grandmother   . Leukemia Maternal  Grandfather   . Migraines Maternal Grandfather   . Colon cancer Cousin     Social History   Socioeconomic History  . Marital status: Single    Spouse name: Not on file  . Number of children: Not on file  . Years of education: Not on file  . Highest education level: Not on file  Occupational History  . Not on file  Social Needs  . Financial resource strain: Not on file  . Food insecurity:    Worry: Not on file    Inability: Not on file  . Transportation needs:    Medical: Not on file    Non-medical: Not on file  Tobacco Use  . Smoking status: Never Smoker  . Smokeless tobacco: Never Used  Substance and Sexual Activity  . Alcohol use: No    Alcohol/week: 0.0 oz  . Drug use: No  . Sexual activity: Yes    Partners: Male    Birth control/protection: Pill  Lifestyle  . Physical activity:    Days per week: Not on file    Minutes per session: Not on file  . Stress: Not on file  Relationships  . Social connections:    Talks on phone: Not on file    Gets together: Not on file    Attends religious service: Not on file    Active member of club or organization: Not on file    Attends meetings of clubs or organizations: Not on file    Relationship status: Not on file  . Intimate partner violence:    Fear of current or ex partner: Not on file    Emotionally abused: Not on file    Physically abused: Not on file    Forced sexual activity: Not on file  Other Topics Concern  . Not on file  Social History Narrative  . Not on file    Review of Systems  Constitutional: Negative.   HENT: Negative.   Eyes: Negative.   Respiratory: Negative.   Cardiovascular: Negative.   Gastrointestinal: Negative.   Genitourinary: Negative.   Musculoskeletal: Negative.   Skin: Negative.   Neurological: Negative.   Psychiatric/Behavioral: Negative.     PHYSICAL EXAMINATION:    There were no vitals taken for this visit.    General appearance: alert, cooperative and appears stated  age Neck: no adenopathy, supple, symmetrical, trachea midline and thyroid normal to inspection and palpation  ASSESSMENT Missed cycle, first missed  cycle on POP (on them for 2 years) Skipped pill 2-3 weeks ago, no back up contraception C/O fatigue    PLAN UPT negative BhcG, TSH If testing is normal, she should just monitor her cycles. It can be normal to not have cycles on the micronor Discussed using back up contraception for a week if she misses a pill   An After Visit Summary was printed and given to the patient.

## 2018-06-19 NOTE — Telephone Encounter (Signed)
Spoke with patient. LMP 05/02/18. On POP for contraceptive, missed 1 pill couple of wks ago. UPT negative 1.5 wks ago. Reports cramping and spotting on 7/1, denies pain or bleeding today. Is concerned, usually has a cycle with POP.  Intermittent nausea, no vomiting. Denies fever/chills. Patient requesting OV for pregnancy test.   OV scheduled for today at 3:45pm with Dr. Oscar LaJertson.   Routing to provider for final review. Patient is agreeable to disposition. Will close encounter.

## 2018-06-20 ENCOUNTER — Telehealth: Payer: Self-pay

## 2018-06-20 ENCOUNTER — Ambulatory Visit: Payer: BLUE CROSS/BLUE SHIELD | Admitting: Certified Nurse Midwife

## 2018-06-20 LAB — BETA HCG QUANT (REF LAB)

## 2018-06-20 LAB — TSH: TSH: 1.72 u[IU]/mL (ref 0.450–4.500)

## 2018-06-20 NOTE — Telephone Encounter (Signed)
-----   Message from Romualdo BolkJill Evelyn Jertson, MD sent at 06/20/2018  8:26 AM EDT ----- Negative BhcG, normal TSH. Please inform.

## 2018-06-20 NOTE — Telephone Encounter (Signed)
Attempted to reach patient at number provided [phone], there was no answer and voicemail box is full.

## 2018-06-20 NOTE — Telephone Encounter (Signed)
Patient returning call to Kaitlyn. °

## 2018-06-20 NOTE — Telephone Encounter (Signed)
Spoke with patient. Advised of results. Patient verbalizes understanding.Encouner closed.

## 2018-08-29 NOTE — Progress Notes (Signed)
Patient in today for  3rd Gardasil injection.   Contraception: POP LMP: 08/13/18 Last AEX: 03/01/18 with JJ  Injection given in Left deltoid. Patient tolerated shot well.   Patient informed that this is the last injection in the series.   Advised patient, if not on birth control, to return for next injection with cycle.   Routed to provider for final review.  Encounter closed.

## 2018-08-30 ENCOUNTER — Other Ambulatory Visit: Payer: Self-pay

## 2018-08-30 ENCOUNTER — Ambulatory Visit (INDEPENDENT_AMBULATORY_CARE_PROVIDER_SITE_OTHER): Payer: BLUE CROSS/BLUE SHIELD

## 2018-08-30 VITALS — BP 110/68 | HR 72 | Resp 14 | Ht 63.5 in | Wt 165.2 lb

## 2018-08-30 DIAGNOSIS — Z0000 Encounter for general adult medical examination without abnormal findings: Secondary | ICD-10-CM

## 2018-08-30 DIAGNOSIS — Z23 Encounter for immunization: Secondary | ICD-10-CM | POA: Diagnosis not present

## 2018-08-30 DIAGNOSIS — E781 Pure hyperglyceridemia: Secondary | ICD-10-CM

## 2018-08-30 DIAGNOSIS — R7989 Other specified abnormal findings of blood chemistry: Secondary | ICD-10-CM

## 2018-09-04 ENCOUNTER — Other Ambulatory Visit (INDEPENDENT_AMBULATORY_CARE_PROVIDER_SITE_OTHER): Payer: BLUE CROSS/BLUE SHIELD

## 2018-09-04 DIAGNOSIS — E781 Pure hyperglyceridemia: Secondary | ICD-10-CM

## 2018-09-04 DIAGNOSIS — R7989 Other specified abnormal findings of blood chemistry: Secondary | ICD-10-CM

## 2018-09-04 NOTE — Addendum Note (Signed)
Addended by: Joeseph AmorFAST, Brannon Levene L on: 09/04/2018 09:08 AM   Modules accepted: Orders

## 2018-09-04 NOTE — Addendum Note (Signed)
Addended by: Joeseph AmorFAST, Kelsy Polack L on: 09/04/2018 08:20 AM   Modules accepted: Orders

## 2018-09-05 LAB — LIPID PANEL
CHOLESTEROL TOTAL: 154 mg/dL (ref 100–199)
Chol/HDL Ratio: 3.9 ratio (ref 0.0–4.4)
HDL: 40 mg/dL (ref >39)
LDL Calculated: 100 mg/dL — ABNORMAL HIGH (ref 0–99)
Triglycerides: 69 mg/dL (ref 0–149)
VLDL CHOLESTEROL CAL: 14 mg/dL (ref 5–40)

## 2018-09-05 LAB — COMPREHENSIVE METABOLIC PANEL
ALBUMIN: 4.3 g/dL (ref 3.5–5.5)
ALK PHOS: 62 IU/L (ref 39–117)
ALT: 18 IU/L (ref 0–32)
AST: 16 IU/L (ref 0–40)
Albumin/Globulin Ratio: 2 (ref 1.2–2.2)
BILIRUBIN TOTAL: 0.6 mg/dL (ref 0.0–1.2)
BUN / CREAT RATIO: 15 (ref 9–23)
BUN: 16 mg/dL (ref 6–20)
CHLORIDE: 103 mmol/L (ref 96–106)
CO2: 23 mmol/L (ref 20–29)
CREATININE: 1.08 mg/dL — AB (ref 0.57–1.00)
Calcium: 9.3 mg/dL (ref 8.7–10.2)
GFR calc Af Amer: 76 mL/min/{1.73_m2} (ref >59)
GFR calc non Af Amer: 66 mL/min/{1.73_m2} (ref >59)
GLOBULIN, TOTAL: 2.1 g/dL (ref 1.5–4.5)
Glucose: 92 mg/dL (ref 65–99)
POTASSIUM: 4.5 mmol/L (ref 3.5–5.2)
SODIUM: 142 mmol/L (ref 134–144)
Total Protein: 6.4 g/dL (ref 6.0–8.5)

## 2019-03-05 ENCOUNTER — Other Ambulatory Visit: Payer: Self-pay

## 2019-03-05 MED ORDER — NORETHINDRONE 0.35 MG PO TABS: 1.0000 | ORAL_TABLET | Freq: Every day | ORAL | 0 refills | Status: DC

## 2019-03-05 NOTE — Telephone Encounter (Signed)
Medication refill request: Hannah Hodge  Last AEX:  03/01/18 Next AEX: 03/21/19  Last MMG (if hormonal medication request): NA Refill authorized: #1 pack with 0 rf to get her to her AEX

## 2019-03-21 ENCOUNTER — Ambulatory Visit: Payer: BLUE CROSS/BLUE SHIELD | Admitting: Obstetrics and Gynecology

## 2019-04-08 ENCOUNTER — Other Ambulatory Visit: Payer: Self-pay

## 2019-04-08 MED ORDER — NORETHINDRONE 0.35 MG PO TABS: 1.0000 | ORAL_TABLET | Freq: Every day | ORAL | 0 refills | Status: DC

## 2019-04-08 NOTE — Telephone Encounter (Signed)
Medication refill request: OCP Last AEX:  03/01/18 JJ Next AEX: none scheduled Last MMG (if hormonal medication request): n/a Refill authorized: order pended #3 packs w/0 refills if authorized

## 2019-06-20 ENCOUNTER — Ambulatory Visit: Payer: BLUE CROSS/BLUE SHIELD | Admitting: Obstetrics and Gynecology

## 2019-06-20 ENCOUNTER — Telehealth: Payer: Self-pay | Admitting: Obstetrics and Gynecology

## 2019-06-20 NOTE — Progress Notes (Deleted)
38 y.o. [id number] Single White or Caucasian Not Hispanic or Latino female here for annual exam.      No LMP recorded. (Menstrual status: Oral contraceptives).          Sexually active: {yes no:314532}  The current method of family planning is {contraception:315051}.    Exercising: {yes no:314532}  {types:19826} Smoker:  {YES P5382123  Health Maintenance: Pap:  03/01/2018 POS HPV, 09-05-17 WNL, 01-12-17 WNL, , 02-15-16 ASCUS + HPV colposcopy 03-01-16  CIN-I VAIN II ECC neg  History of abnormal Pap:  Yes  03/15/2018 ECC NEG TDaP: 03/01/2018 Gardasil: Completed all 3   reports that she has never smoked. She has never used smokeless tobacco. She reports that she does not drink alcohol or use drugs.  Past Medical History:  Diagnosis Date  . Anxiety   . Dysrhythmia    tachycardia with anxiety  . Environmental allergies   . GERD (gastroesophageal reflux disease)   . Hypertension   . Migraine   . Shortness of breath dyspnea    with anxiety  . Thyroid nodule    benign    Past Surgical History:  Procedure Laterality Date  . BIOPSY THYROID    . CERVICAL CONIZATION W/BX N/A 06/02/2016   Procedure: J PLASMA LASER WITH REMOVAL OF VAGINAL DYSPLASIA;  Surgeon: Salvadore Dom, MD;  Location: [facility];  Service: Gynecology;  Laterality: N/A;  will use J-plasma.  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  J-plasma rep confirmed on 05/26/16.  Gay Filler notified  . CESAREAN SECTION    . COLPOSCOPY    . MOLE REMOVAL  02/2018   removed from back   . WISDOM TOOTH EXTRACTION      Current Outpatient Medications  Medication Sig Dispense Refill  . buPROPion (WELLBUTRIN XL) 150 MG 24 hr tablet Take 150 mg by mouth daily.   0  . busPIRone (BUSPAR) 15 MG tablet Take 15 mg by mouth 2 (two) times daily.     . cetirizine (ZYRTEC) 10 MG tablet Take 10 mg by mouth daily.    . cholecalciferol (VITAMIN D) 1000 units tablet Take 1,000 Units by mouth daily.    Marland Kitchen EPINEPHrine (EPIPEN 2-PAK) 0.3 mg/0.3 mL IJ SOAJ injection Inject  into the muscle once.    . famotidine (PEPCID) 20 MG tablet Take 20 mg by mouth.    Marland Kitchen ibuprofen (ADVIL,MOTRIN) 200 MG tablet Take 600 mg by mouth every 6 (six) hours as needed for headache or moderate pain.    . metoprolol (LOPRESSOR) 50 MG tablet Take 50 mg by mouth daily.    . montelukast (SINGULAIR) 10 MG tablet Take 10 mg by mouth at bedtime.   2  . Multiple Vitamin (MULTIVITAMIN) tablet Take 1 tablet by mouth daily.    . norethindrone (MICRONOR) 0.35 MG tablet Take 1 tablet (0.35 mg total) by mouth daily. 3 Package 0  . traZODone (DESYREL) 100 MG tablet Take 100-200 mg by mouth at bedtime. sleep     No current facility-administered medications for this visit.     Family History  Problem Relation Age of Onset  . Hypertension Mother   . Atrial fibrillation Mother   . Migraines Mother   . Migraines Sister   . Migraines Brother   . Dementia Maternal Grandmother   . Leukemia Maternal Grandfather   . Migraines Maternal Grandfather   . Colon cancer Cousin     Review of Systems  Exam:   There were no vitals taken for this visit.  Weight change: @WEIGHTCHANGE @ Height:  Ht Readings from Last 3 Encounters:  08/30/18 5' 3.5" (1.613 m)  05/01/18 5\' 3"  (1.6 m)  03/15/18 5' 3.5" (1.613 m)    General appearance: alert, cooperative and appears stated age Head: Normocephalic, without obvious abnormality, atraumatic Neck: no adenopathy, supple, symmetrical, trachea midline and thyroid {CHL AMB PHY EX THYROID NORM DEFAULT:[phone]::"normal to inspection and palpation"} Lungs: clear to auscultation bilaterally Cardiovascular: regular rate and rhythm Breasts: {Exam; breast:13139::"normal appearance, no masses or tenderness"} Abdomen: soft, non-tender; non distended,  no masses,  no organomegaly Extremities: extremities normal, atraumatic, no cyanosis or edema Skin: Skin color, texture, turgor normal. No rashes or lesions Lymph nodes: Cervical, supraclavicular, and axillary nodes  normal. No abnormal inguinal nodes palpated Neurologic: Grossly normal   Pelvic: External genitalia:  no lesions              Urethra:  normal appearing urethra with no masses, tenderness or lesions              Bartholins and Skenes: normal                 Vagina: normal appearing vagina with normal color and discharge, no lesions              Cervix: {CHL AMB PHY EX CERVIX NORM DEFAULT:[phone]::"no lesions"}               Bimanual Exam:  Uterus:  {CHL AMB PHY EX UTERUS NORM [phone]::"normal size, contour, position, consistency, mobility, non-tender"}              Adnexa: {CHL AMB PHY EX ADNEXA NO MASS DEFAULT:[phone]::"no mass, fullness, tenderness"}               Rectovaginal: Confirms               Anus:  normal sphincter tone, no lesions  Chaperone was present for exam.  A:  Well Woman with normal exam  P:

## 2019-06-20 NOTE — Telephone Encounter (Signed)
Patient arrived to her 11:00 am aex appointment today at 11:03 am. She rescheduled to 06/27/19 at 8:00am.

## 2019-06-26 NOTE — Progress Notes (Signed)
38 y.o. [id number] Single White or Caucasian Not Hispanic or Latino female here for annual exam.  She is on POP. Cycles are irregular. She has intermittent spotting every 1-2 months for 5-7 days. Mild cramps. Sexually active, no partner x 3 years. No dyspareunia.  She c/o burning with urination for the last 1-2 weeks, burns the whole time she voids, no frequency or urgency. Burning is intermittent and mild.   She c/o leg cramps at night, recently not as bad.     No LMP recorded. (Menstrual status: Oral contraceptives).          Sexually active: Yes.    The current method of family planning is POP (estrogen/progesterone).    Exercising: No.  The patient does not participate in regular exercise at present. 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 She has never used smokeless tobacco. She reports that she does not drink alcohol or use drugs. She is a Doctor, hospital. Daughter is 63 (54 in August).  Past Medical History:  Diagnosis Date  . Anxiety   . Dysrhythmia    tachycardia with anxiety  . Environmental allergies   . GERD (gastroesophageal reflux disease)   . Hypertension   . Migraine   . Shortness of breath dyspnea    with anxiety  . Thyroid nodule    benign    Past Surgical History:  Procedure Laterality Date  . BIOPSY THYROID    . CERVICAL CONIZATION W/BX N/A 06/02/2016   Procedure: J PLASMA LASER WITH REMOVAL OF VAGINAL DYSPLASIA;  Surgeon: Salvadore Dom, MD;  Location: [facility];  Service: Gynecology;  Laterality: N/A;  will use J-plasma.  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  J-plasma rep confirmed on 05/26/16.  Gay Filler notified  . CESAREAN SECTION    . COLPOSCOPY    . MOLE REMOVAL  02/2018    removed from back   . WISDOM TOOTH EXTRACTION      Current Outpatient Medications  Medication Sig Dispense Refill  . buPROPion (WELLBUTRIN XL) 150 MG 24 hr tablet Take 150 mg by mouth daily.   0  . busPIRone (BUSPAR) 15 MG tablet Take 15 mg by mouth 2 (two) times daily.     . cetirizine (ZYRTEC) 10 MG tablet Take 10 mg by mouth daily.    . cholecalciferol (VITAMIN D) 1000 units tablet Take 1,000 Units by mouth daily.    Marland Kitchen EPINEPHrine (EPIPEN 2-PAK) 0.3 mg/0.3 mL IJ SOAJ injection Inject into the muscle once.    . famotidine (PEPCID) 20 MG tablet Take 20 mg by mouth.    Marland Kitchen ibuprofen (ADVIL,MOTRIN) 200 MG tablet Take 600 mg by mouth every 6 (six) hours as needed for headache or moderate pain.    . metoprolol (LOPRESSOR) 50 MG tablet Take 50 mg by mouth daily.    . montelukast (SINGULAIR) 10 MG tablet Take 10 mg by mouth at bedtime.   2  . Multiple Vitamin (MULTIVITAMIN) tablet Take 1 tablet by mouth daily.    . norethindrone (MICRONOR) 0.35 MG tablet Take 1 tablet (0.35 mg total) by mouth daily. 3 Package 0  . traZODone (DESYREL) 100 MG tablet Take 100-200 mg by mouth at bedtime. sleep  No current facility-administered medications for this visit.     Family History  Problem Relation Age of Onset  . Hypertension Mother   . Atrial fibrillation Mother   . Migraines Mother   . Migraines Sister   . Migraines Brother   . Dementia Maternal Grandmother   . Leukemia Maternal Grandfather   . Migraines Maternal Grandfather   . Colon cancer Cousin   . Bone cancer Maternal Uncle     Review of Systems  Constitutional: Negative.   HENT: Negative.   Eyes: Negative.   Respiratory: Negative.   Cardiovascular: Negative.   Gastrointestinal: Negative.   Endocrine: Negative.   Genitourinary: Negative.   Musculoskeletal:       Leg cramps  Skin: Negative.   Allergic/Immunologic: Negative.   Neurological: Negative.   Hematological: Negative.   Psychiatric/Behavioral: Negative.      Exam:   BP 112/84 (BP Location: Right Arm, Patient Position: Sitting, Cuff Size: Normal)   Pulse 60   Temp (!) 97.5 F (36.4 C) (Skin)   Ht 5' 3.39" (1.61 m)   Wt 166 lb 6.4 oz (75.5 kg)   BMI 29.12 kg/m   Weight change: @WEIGHTCHANGE @ Height:   Height: 5' 3.39" (161 cm)  Ht Readings from Last 3 Encounters:  06/27/19 5' 3.39" (1.61 m)  08/30/18 5' 3.5" (1.613 m)  05/01/18 5\' 3"  (1.6 m)    General appearance: alert, cooperative and appears stated age Head: Normocephalic, without obvious abnormality, atraumatic Neck: no adenopathy, supple, symmetrical, trachea midline and thyroid normal to inspection and palpation Lungs: clear to auscultation bilaterally Cardiovascular: regular rate and rhythm Breasts: normal appearance, no masses or tenderness Abdomen: soft, non-tender; non distended,  no masses,  no organomegaly Extremities: extremities normal, atraumatic, no cyanosis or edema Skin: Skin color, texture, turgor normal. No rashes or lesions Lymph nodes: Cervical, supraclavicular, and axillary nodes normal. No abnormal inguinal nodes palpated Neurologic: Grossly normal   Pelvic: External genitalia:  no lesions              Urethra:  normal appearing urethra with no masses, tenderness or lesions              Bartholins and Skenes: normal                 Vagina: normal appearing vagina with normal color and discharge, no lesions              Cervix: no lesions               Bimanual Exam:  Uterus:  normal size, contour, position, consistency, mobility, non-tender              Adnexa: no mass, fullness, tenderness               Rectovaginal: Confirms               Anus:  normal sphincter tone, no lesions  Chaperone was present for exam.  A:  Well Woman with normal exam  H/O VAIN II and CIN I, s/p ablation. Last year with + HPV and negative/unsatisfactory colposcopy  Dysuria, intermittent and mild  Legs cramps, information given  P:   Pap with hpv (cervix/vagina)  CCUA  for ua, c&s  Continue micronor  Discussed breast self exam  Discussed calcium and vit D intake  Labs with her primary

## 2019-06-27 ENCOUNTER — Encounter: Payer: Self-pay | Admitting: Obstetrics and Gynecology

## 2019-06-27 ENCOUNTER — Other Ambulatory Visit (HOSPITAL_COMMUNITY)
Admission: RE | Admit: 2019-06-27 | Discharge: 2019-06-27 | Disposition: A | Discharge status: Home / Self Care | Payer: BLUE CROSS/BLUE SHIELD | Source: Ambulatory Visit | Attending: Obstetrics and Gynecology | Admitting: Obstetrics and Gynecology

## 2019-06-27 ENCOUNTER — Ambulatory Visit (INDEPENDENT_AMBULATORY_CARE_PROVIDER_SITE_OTHER): Payer: BLUE CROSS/BLUE SHIELD | Admitting: Obstetrics and Gynecology

## 2019-06-27 ENCOUNTER — Other Ambulatory Visit: Payer: Self-pay

## 2019-06-27 VITALS — BP 112/84 | HR 60 | Temp 97.5°F | Ht 63.39 in | Wt 166.4 lb

## 2019-06-27 DIAGNOSIS — Z124 Encounter for screening for malignant neoplasm of cervix: Secondary | ICD-10-CM

## 2019-06-27 DIAGNOSIS — Z01419 Encounter for gynecological examination (general) (routine) without abnormal findings: Secondary | ICD-10-CM

## 2019-06-27 DIAGNOSIS — Z1272 Encounter for screening for malignant neoplasm of vagina: Secondary | ICD-10-CM | POA: Insufficient documentation

## 2019-06-27 DIAGNOSIS — R252 Cramp and spasm: Secondary | ICD-10-CM

## 2019-06-27 DIAGNOSIS — R300 Dysuria: Secondary | ICD-10-CM

## 2019-06-27 DIAGNOSIS — Z3041 Encounter for surveillance of contraceptive pills: Secondary | ICD-10-CM | POA: Diagnosis not present

## 2019-06-27 MED ORDER — NORETHINDRONE 0.35 MG PO TABS: 1.0000 | ORAL_TABLET | Freq: Every day | ORAL | 3 refills | Status: DC

## 2019-06-27 NOTE — Patient Instructions (Signed)
EXERCISE AND DIET:  We recommended that you start or continue a regular exercise program for good health. Regular exercise means any activity that makes your heart beat faster and makes you sweat.  We recommend exercising at least 30 minutes per day at least 3 days a week, preferably 4 or 5.  We also recommend a diet low in fat and sugar.  Inactivity, poor dietary choices and obesity can cause diabetes, heart attack, stroke, and kidney damage, among others.    ALCOHOL AND SMOKING:  Women should limit their alcohol intake to no more than 7 drinks/beers/glasses of wine (combined, not each!) per week. Moderation of alcohol intake to this level decreases your risk of breast cancer and liver damage. And of course, no recreational drugs are part of a healthy lifestyle.  And absolutely no smoking or even second hand smoke. Most people know smoking can cause heart and lung diseases, but did you know it also contributes to weakening of your bones? Aging of your skin?  Yellowing of your teeth and nails?  CALCIUM AND VITAMIN D:  Adequate intake of calcium and Vitamin D are recommended.  The recommendations for exact amounts of these supplements seem to change often, but generally speaking 1,000 mg of calcium (between diet and supplement) and 800 units of Vitamin D per day seems prudent. Certain women may benefit from higher intake of Vitamin D.  If you are among these women, your doctor will have told you during your visit.    PAP SMEARS:  Pap smears, to check for cervical cancer or precancers,  have traditionally been done yearly, although recent scientific advances have shown that most women can have pap smears less often.  However, every woman still should have a physical exam from her gynecologist every year. It will include a breast check, inspection of the vulva and vagina to check for abnormal growths or skin changes, a visual exam of the cervix, and then an exam to evaluate the size and shape of the uterus and  ovaries.  And after 38 years of age, a rectal exam is indicated to check for rectal cancers. We will also provide age appropriate advice regarding health maintenance, like when you should have certain vaccines, screening for sexually transmitted diseases, bone density testing, colonoscopy, mammograms, etc.   MAMMOGRAMS:  All women over 75 years old should have a yearly mammogram. Many facilities now offer a "3D" mammogram, which may cost around $50 extra out of pocket. If possible,  we recommend you accept the option to have the 3D mammogram performed.  It both reduces the number of women who will be called back for extra views which then turn out to be normal, and it is better than the routine mammogram at detecting truly abnormal areas.    COLON CANCER SCREENING: Now recommend starting at age 57. At this time colonoscopy is not covered for routine screening until 50. There are take home tests that can be done between 45-49.   COLONOSCOPY:  Colonoscopy to screen for colon cancer is recommended for all women at age 91.  We know, you hate the idea of the prep.  We agree, BUT, having colon cancer and not knowing it is worse!!  Colon cancer so often starts as a polyp that can be seen and removed at colonscopy, which can quite literally save your life!  And if your first colonoscopy is normal and you have no family history of colon cancer, most women don't have to have it again for  10 years.  Once every ten years, you can do something that may end up saving your life, right?  We will be happy to help you get it scheduled when you are ready.  Be sure to check your insurance coverage so you understand how much it will cost.  It may be covered as a preventative service at no cost, but you should check your particular policy.   ° ° ° °Breast Self-Awareness °Breast self-awareness means being familiar with how your breasts look and feel. It involves checking your breasts regularly and reporting any changes to your  health care provider. °Practicing breast self-awareness is important. A change in your breasts can be a sign of a serious medical problem. Being familiar with how your breasts look and feel allows you to find any problems early, when treatment is more likely to be successful. All women should practice breast self-awareness, including women who have had breast implants. °How to do a breast self-exam °One way to learn what is normal for your breasts and whether your breasts are changing is to do a breast self-exam. To do a breast self-exam: °Look for Changes ° °1. Remove all the clothing above your waist. °2. Stand in front of a mirror in a room with good lighting. °3. Put your hands on your hips. °4. Push your hands firmly downward. °5. Compare your breasts in the mirror. Look for differences between them (asymmetry), such as: °? Differences in shape. °? Differences in size. °? Puckers, dips, and bumps in one breast and not the other. °6. Look at each breast for changes in your skin, such as: °? Redness. °? Scaly areas. °7. Look for changes in your nipples, such as: °? Discharge. °? Bleeding. °? Dimpling. °? Redness. °? A change in position. °Feel for Changes °Carefully feel your breasts for lumps and changes. It is best to do this while lying on your back on the floor and again while sitting or standing in the shower or tub with soapy water on your skin. Feel each breast in the following way: °· Place the arm on the side of the breast you are examining above your head. °· Feel your breast with the other hand. °· Start in the nipple area and make ¾ inch (2 cm) overlapping circles to feel your breast. Use the pads of your three middle fingers to do this. Apply light pressure, then medium pressure, then firm pressure. The light pressure will allow you to feel the tissue closest to the skin. The medium pressure will allow you to feel the tissue that is a little deeper. The firm pressure will allow you to feel the tissue  close to the ribs. °· Continue the overlapping circles, moving downward over the breast until you feel your ribs below your breast. °· Move one finger-width toward the center of the body. Continue to use the ¾ inch (2 cm) overlapping circles to feel your breast as you move slowly up toward your collarbone. °· Continue the up and down exam using all three pressures until you reach your armpit. ° °Write Down What You Find ° °Write down what is normal for each breast and any changes that you find. Keep a written record with breast changes or normal findings for each breast. By writing this information down, you do not need to depend only on memory for size, tenderness, or location. Write down where you are in your menstrual cycle, if you are still menstruating. °If you are having trouble noticing differences   in your breasts, do not get discouraged. With time you will become more familiar with the variations in your breasts and more comfortable with the exam. How often should I examine my breasts? Examine your breasts every month. If you are breastfeeding, the best time to examine your breasts is after a feeding or after using a breast pump. If you menstruate, the best time to examine your breasts is 5-7 days after your period is over. During your period, your breasts are lumpier, and it may be more difficult to notice changes. When should I see my health care provider? See your health care provider if you notice:  A change in shape or size of your breasts or nipples.  A change in the skin of your breast or nipples, such as a reddened or scaly area.  Unusual discharge from your nipples.  A lump or thick area that was not there before.  Pain in your breasts.  Anything that concerns you.  Leg Cramps Leg cramps occur when one or more muscles tighten and you have no control over this tightening (involuntary muscle contraction). Muscle cramps can develop in any muscle, but the most common place is in the  calf muscles of the leg. Those cramps can occur during exercise or when you are at rest. Leg cramps are painful, and they may last for a few seconds to a few minutes. Cramps may return several times before they finally stop. Usually, leg cramps are not caused by a serious medical problem. In many cases, the cause is not known. Some common causes include:  Excessive physical effort (overexertion), such as during intense exercise.  Overuse from repetitive motions, or doing the same thing over and over.  Staying in a certain position for a long period of time.  Improper preparation, form, or technique while performing a sport or an activity.  Dehydration.  Injury.  Side effects of certain medicines.  Abnormally low levels of minerals in your blood (electrolytes), especially potassium and calcium. This could result from: ? Pregnancy. ? Taking diuretic medicines. Follow these instructions at home: Eating and drinking  Drink enough fluid to keep your urine pale yellow. Staying hydrated may help prevent cramps.  Eat a healthy diet that includes plenty of nutrients to help your muscles function. A healthy diet includes fruits and vegetables, lean protein, whole grains, and low-fat or nonfat dairy products. Managing pain, stiffness, and swelling      Try massaging, stretching, and relaxing the affected muscle. Do this for several minutes at a time.  If directed, put ice on areas that are sore or painful after a cramp: ? Put ice in a plastic bag. ? Place a towel between your skin and the bag. ? Leave the ice on for 20 minutes, 2-3 times a day.  If directed, apply heat to muscles that are tense or tight. Do this before you exercise, or as often as told by your health care provider. Use the heat source that your health care provider recommends, such as a moist heat pack or a heating pad. ? Place a towel between your skin and the heat source. ? Leave the heat on for 20-30  minutes. ? Remove the heat if your skin turns bright red. This is especially important if you are unable to feel pain, heat, or cold. You may have a greater risk of getting burned.  Try taking hot showers or baths to help relax tight muscles. General instructions  If you are having frequent leg cramps,  avoid intense exercise for several days.  Take over-the-counter and prescription medicines only as told by your health care provider.  Keep all follow-up visits as told by your health care provider. This is important. Contact a health care provider if:  Your leg cramps get more severe or more frequent, or they do not improve over time.  Your foot becomes cold, numb, or blue. Summary  Muscle cramps can develop in any muscle, but the most common place is in the calf muscles of the leg.  Leg cramps are painful, and they may last for a few seconds to a few minutes.  Usually, leg cramps are not caused by a serious medical problem. Often, the cause is not known.  Stay hydrated and take over-the-counter and prescription medicines only as told by your health care provider. This information is not intended to replace advice given to you by your health care provider. Make sure you discuss any questions you have with your health care provider. Document Released: 01/12/2005 Document Revised: 11/17/2017 Document Reviewed: 09/14/2017 Elsevier Patient Education  2020 [employer].

## 2019-06-28 LAB — CYTOLOGY - PAP
Diagnosis: NEGATIVE
HPV: NOT DETECTED

## 2019-06-28 LAB — URINALYSIS, MICROSCOPIC ONLY: Casts: NONE SEEN /lpf

## 2019-06-28 LAB — URINE CULTURE: Organism ID, Bacteria: NO GROWTH

## 2020-01-01 ENCOUNTER — Telehealth: Payer: Self-pay | Admitting: Obstetrics and Gynecology

## 2020-01-01 NOTE — Telephone Encounter (Signed)
Patient is having some irregular bleeding on her current birth control and is complaining of soreness.

## 2020-01-01 NOTE — Telephone Encounter (Signed)
Spoke with patient. Has been on her menses for the past 3.5 weeks, just stopped on 12/31/19. Does not usually have a menses with POP, occasional spotting. Reports brown vaginal d/c and lower back and abdominal pain, unable to provide pain scale number, "just an annoyance". No recent injury. Denies vaginal odor, itching, fever/chills, N/V, urinary symptoms. Has missed 2 POP pills over the past month. Patient is SA, has not taken UPT.   Advised patient missed pills can cause irregular menses, not typically pain, OV recommended. OV scheduled for 1/14 at 9:30am with Dr. Oscar La. Advised patient to take UPT to r/o pregnancy, return call to office if positive Covid19 prescreen negative, precautions reviewed. Patient verbalizes understanding and is agreeable.   Routing to provider for final review. Patient is agreeable to disposition. Will close encounter.

## 2020-01-02 ENCOUNTER — Other Ambulatory Visit: Payer: Self-pay

## 2020-01-02 ENCOUNTER — Ambulatory Visit (INDEPENDENT_AMBULATORY_CARE_PROVIDER_SITE_OTHER): Payer: BC Managed Care – PPO | Admitting: Obstetrics and Gynecology

## 2020-01-02 ENCOUNTER — Encounter: Payer: Self-pay | Admitting: Obstetrics and Gynecology

## 2020-01-02 VITALS — BP 108/64 | HR 91 | Temp 97.3°F | Ht 62.0 in | Wt 153.0 lb

## 2020-01-02 DIAGNOSIS — F418 Other specified anxiety disorders: Secondary | ICD-10-CM | POA: Diagnosis not present

## 2020-01-02 DIAGNOSIS — F439 Reaction to severe stress, unspecified: Secondary | ICD-10-CM

## 2020-01-02 DIAGNOSIS — N921 Excessive and frequent menstruation with irregular cycle: Secondary | ICD-10-CM

## 2020-01-02 DIAGNOSIS — R102 Pelvic and perineal pain: Secondary | ICD-10-CM | POA: Diagnosis not present

## 2020-01-02 LAB — POCT URINE PREGNANCY: Preg Test, Ur: NEGATIVE

## 2020-01-02 MED ORDER — IBUPROFEN 800 MG PO TABS: 800.0000 mg | ORAL_TABLET | Freq: Three times a day (TID) | ORAL | 1 refills | Status: AC | PRN

## 2020-01-02 NOTE — Progress Notes (Signed)
GYNECOLOGY  VISIT   HPI: 39 y.o.   Single White or Caucasian Not Hispanic or Latino  female   [phone] with Patient's last menstrual period was 12/05/2019 (exact date).   here for   She states that she normal has a very light period but last month her period lasted from the 17th of December to th 10th of January. She states that it was a heavy flow in the middle. At the most she was changing a pad 2 x a day. The heavy flow was for 1.5 weeks, other wise spotting. She was having mild cramps for most of the time she was bleeding (more than normal). She is still achy in lower back and abdomen, it was constant, now intermittent, less intense.   She did miss 2 pills that cycle. She is sexually active, no dyspareunia, not using condoms. Same partner x 3.5 years.   She is under lots of stress. Her daughter is currently hospitalized in the behavioral unit at [facility]. She is 13, struggling with depression and anxiety. She is transgender, kids have been mean, her dad disowned her.  The patient is on medication for anxiety and depression, on medication, has family support. She has a Engineer, water who manages her medication.   GYNECOLOGIC HISTORY: Patient's last menstrual period was 12/05/2019 (exact date). Contraception: Micronor  Menopausal hormone therapy: no        OB History    Gravida  3   Para  1   Term  1   Preterm      AB  2   Living  1     SAB  2   TAB      Ectopic      Multiple      Live Births  1              Patient Active Problem List   Diagnosis Date Noted  . Abnormal Pap smear of cervix 10/17/2017  . Anxiety 10/17/2017  . Dysmenorrhea 10/17/2017  . Menorrhagia 10/17/2017  . Panic attacks 10/05/2017  . Environmental allergies   . Migraines 02/15/2016  . Hypertension 02/15/2016  . GAD (generalized anxiety disorder) 06/04/2015  . 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 Insomnia 07/17/2014  . Fatigue 06/05/2014  .  Memory loss 06/05/2014  . Visual disturbance 06/05/2014    Past Medical History:  Diagnosis Date  . Anxiety   . Dysrhythmia    tachycardia with anxiety  . Environmental allergies   . GERD (gastroesophageal reflux disease)   . Hypertension   . Migraine   . Shortness of breath dyspnea    with anxiety  . Thyroid nodule    benign    Past Surgical History:  Procedure Laterality Date  . BIOPSY THYROID    . 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  J-plasma rep confirmed on 05/26/16.  Gay Filler notified  . CESAREAN SECTION    . COLPOSCOPY    . MOLE REMOVAL  02/2018   removed from back   . WISDOM TOOTH EXTRACTION      Current Outpatient Medications  Medication Sig Dispense Refill  . LORazepam (ATIVAN) 0.5 MG tablet Take one tablet bid prn    . buPROPion (WELLBUTRIN XL) 150 MG 24 hr tablet Take 150 mg  by mouth daily.   0  . busPIRone (BUSPAR) 15 MG tablet Take 15 mg by mouth 2 (two) times daily.     . cetirizine (ZYRTEC) 10 MG tablet Take 10 mg by mouth daily.    . cholecalciferol (VITAMIN D) 1000 units tablet Take 1,000 Units by mouth daily.    Marland Kitchen EPINEPHrine (EPIPEN 2-PAK) 0.3 mg/0.3 mL IJ SOAJ injection Inject into the muscle once.    . famotidine (PEPCID) 20 MG tablet Take 20 mg by mouth.    Marland Kitchen ibuprofen (ADVIL,MOTRIN) 200 MG tablet Take 600 mg by mouth every 6 (six) hours as needed for headache or moderate pain.    . metoprolol (LOPRESSOR) 50 MG tablet Take 50 mg by mouth daily.    . montelukast (SINGULAIR) 10 MG tablet Take 10 mg by mouth at bedtime.   2  . Multiple Vitamin (MULTIVITAMIN) tablet Take 1 tablet by mouth daily.    . norethindrone (MICRONOR) 0.35 MG tablet Take 1 tablet (0.35 mg total) by mouth daily. 3 Package 3  . traZODone (DESYREL) 100 MG tablet Take 100-200 mg  by mouth at bedtime. sleep     No current facility-administered medications for this visit.     ALLERGIES: Arlice Colt isothiocyanate], Peanut-containing drug products, Shellfish allergy, and Soy allergy  Family History  Problem Relation Age of Onset  . Hypertension Mother   . Atrial fibrillation Mother   . Migraines Mother   . Migraines Sister   . Migraines Brother   . Dementia Maternal Grandmother   . Leukemia Maternal Grandfather   . Migraines Maternal Grandfather   . Colon cancer Cousin   . Bone cancer Maternal Uncle     Social History   Socioeconomic History  . Marital status: Single    Spouse name: Not on file  . Number of children: Not on file  . Years of education: Not on file  . Highest education level: Not on file  Occupational History  . Not on file  Tobacco Use  . Smoking status: Never Smoker  . Smokeless tobacco: Never Used  Substance and Sexual Activity  . Alcohol use: No    Alcohol/week: 0.0 standard drinks  . Drug use: No  . Sexual activity: Yes    Partners: Male    Birth control/protection: Pill  Other Topics Concern  . Not on file  Social History Narrative  . Not on file   Social Determinants of Health   Financial Resource Strain:   . Difficulty of Paying Living Expenses: Not on file  Food Insecurity:   . Worried About Programme researcher, broadcasting/film/video in the Last Year: Not on file  . Ran Out of Food in the Last Year: Not on file  Transportation Needs:   . Lack of Transportation (Medical): Not on file  . Lack of Transportation (Non-Medical): Not on file  Physical Activity:   . Days of Exercise per Week: Not on file  . Minutes of Exercise per Session: Not on file  Stress:   . Feeling of Stress : Not on file  Social Connections:   . Frequency of Communication with Friends and Family: Not on file  . Frequency of Social Gatherings with Friends and Family: Not on file  . Attends Religious Services: Not on file  . Active Member of Clubs or  Organizations: Not on file  . Attends Banker Meetings: Not on file  . Marital Status: Not on file  Intimate Partner Violence:   . Fear of Current  or Ex-Partner: Not on file  . Emotionally Abused: Not on file  . Physically Abused: Not on file  . Sexually Abused: Not on file    Review of Systems  Constitutional: Negative.   HENT: Negative.   Respiratory: Negative.   Cardiovascular: Negative.   Gastrointestinal: Positive for diarrhea. Negative for nausea.  Genitourinary: Negative.   Skin: Negative.   Neurological: Negative.   Endo/Heme/Allergies: Negative.   Psychiatric/Behavioral: Positive for depression.    PHYSICAL EXAMINATION:    BP 108/64   Pulse 91   Temp (!) 97.3 F (36.3 C)   Ht 5\' 2"  (1.575 m)   Wt 153 lb (69.4 kg)   LMP 12/05/2019 (Exact Date)   SpO2 98%   BMI 27.98 kg/m     General appearance: alert, cooperative and appears stated age Abdomen: soft, mildly tender BLQ, no rebound, no guarding; non distended, no masses,  no organomegaly  Pelvic: External genitalia:  no lesions              Urethra:  normal appearing urethra with no masses, tenderness or lesions              Bartholins and Skenes: normal                 Vagina: normal appearing vagina with normal color and discharge, no lesions              Cervix: no cervical motion tenderness and no lesions              Bimanual Exam:  Uterus:  normal size, contour, position, consistency, mobility, non-tender              Adnexa: no masses, mild bilateral tenderness.                Chaperone was present for exam.  ASSESSMENT Breakthrough bleeding on POP, suspect from missed pills Pelvic cramping/aching Under tremendous stress, struggles with depression and anxiety baseline. No thoughts of hurting herself or others    PLAN UPT negative Genprobe sent Discussed taking the POP at the same time daily Ibuprofen for prn pain, if she has another prolonged bleed she can take the ibuprofen x 5  days which may help stop the bleeding She will f/u with her Psychologist for medication management Name of therapist given   An After Visit Summary was printed and given to the patient.   ~30 minutes was spent in total patient care

## 2020-01-02 NOTE — Patient Instructions (Signed)
[place], therapist. [phone], [phone]   Generalized Anxiety Disorder, Adult Generalized anxiety disorder (GAD) is a mental health disorder. People with this condition constantly worry about everyday events. Unlike normal anxiety, worry related to GAD is not triggered by a specific event. These worries also do not fade or get better with time. GAD interferes with life functions, including relationships, work, and school. GAD can vary from mild to severe. People with severe GAD can have intense waves of anxiety with physical symptoms (panic attacks). What are the causes? The exact cause of GAD is not known. What increases the risk? This condition is more likely to develop in:  Women.  People who have a family history of anxiety disorders.  People who are very shy.  People who experience very stressful life events, such as the death of a loved one.  People who have a very stressful family environment. What are the signs or symptoms? People with GAD often worry excessively about many things in their lives, such as their health and family. They may also be overly concerned about:  Doing well at work.  Being on time.  Natural disasters.  Friendships. Physical symptoms of GAD include:  Fatigue.  Muscle tension or having muscle twitches.  Trembling or feeling shaky.  Being easily startled.  Feeling like your heart is pounding or racing.  Feeling out of breath or like you cannot take a deep breath.  Having trouble falling asleep or staying asleep.  Sweating.  Nausea, diarrhea, or irritable bowel syndrome (IBS).  Headaches.  Trouble concentrating or remembering facts.  Restlessness.  Irritability. How is this diagnosed? Your health care provider can diagnose GAD based on your symptoms and medical history. You will also have a physical exam. The health care provider will ask specific questions about your symptoms, including how severe they are, when they  started, and if they come and go. Your health care provider may ask you about your use of alcohol or drugs, including prescription medicines. Your health care provider may refer you to a mental health specialist for further evaluation. Your health care provider will do a thorough examination and may perform additional tests to rule out other possible causes of your symptoms. To be diagnosed with GAD, a person must have anxiety that:  Is out of his or her control.  Affects several different aspects of his or her life, such as work and relationships.  Causes distress that makes him or her unable to take part in normal activities.  Includes at least three physical symptoms of GAD, such as restlessness, fatigue, trouble concentrating, irritability, muscle tension, or sleep problems. Before your health care provider can confirm a diagnosis of GAD, these symptoms must be present more days than they are not, and they must last for six months or longer. How is this treated? The following therapies are usually used to treat GAD:  Medicine. Antidepressant medicine is usually prescribed for long-term daily control. Antianxiety medicines may be added in severe cases, especially when panic attacks occur.  Talk therapy (psychotherapy). Certain types of talk therapy can be helpful in treating GAD by providing support, education, and guidance. Options include: ? Cognitive behavioral therapy (CBT). People learn coping skills and techniques to ease their anxiety. They learn to identify unrealistic or negative thoughts and behaviors and to replace them with positive ones. ? Acceptance and commitment therapy (ACT). This treatment teaches people how to be mindful as a way to cope with unwanted thoughts and feelings. ? Biofeedback. This process  trains you to manage your body's response (physiological response) through breathing techniques and relaxation methods. You will work with a therapist while machines are used  to monitor your physical symptoms.  Stress management techniques. These include yoga, meditation, and exercise. A mental health specialist can help determine which treatment is best for you. Some people see improvement with one type of therapy. However, other people require a combination of therapies. Follow these instructions at home:  Take over-the-counter and prescription medicines only as told by your health care provider.  Try to maintain a normal routine.  Try to anticipate stressful situations and allow extra time to manage them.  Practice any stress management or self-calming techniques as taught by your health care provider.  Do not punish yourself for setbacks or for not making progress.  Try to recognize your accomplishments, even if they are small.  Keep all follow-up visits as told by your health care provider. This is important. Contact a health care provider if:  Your symptoms do not get better.  Your symptoms get worse.  You have signs of depression, such as: ? A persistently sad, cranky, or irritable mood. ? Loss of enjoyment in activities that used to bring you joy. ? Change in weight or eating. ? Changes in sleeping habits. ? Avoiding friends or family members. ? Loss of energy for normal tasks. ? Feelings of guilt or worthlessness. Get help right away if:  You have serious thoughts about hurting yourself or others. If you ever feel like you may hurt yourself or others, or have thoughts about taking your own life, get help right away. You can go to your nearest emergency department or call:  Your local emergency services (911 in the [place]  A suicide crisis helpline, such as the National Suicide Prevention Lifeline at 940 215 1644. This is open 24 hours a day. Summary  Generalized anxiety disorder (GAD) is a mental health disorder that involves worry that is not triggered by a specific event.  People with GAD often worry excessively about many things  in their lives, such as their health and family.  GAD may cause physical symptoms such as restlessness, trouble concentrating, sleep problems, frequent sweating, nausea, diarrhea, headaches, and trembling or muscle twitching.  A mental health specialist can help determine which treatment is best for you. Some people see improvement with one type of therapy. However, other people require a combination of therapies. This information is not intended to replace advice given to you by your health care provider. Make sure you discuss any questions you have with your health care provider. Document Revised: 11/17/2017 Document Reviewed: 10/25/2016 Elsevier Patient Education  2020 Elsevier Inc. Major Depressive Disorder, Adult Major depressive disorder (MDD) is a mental health condition. It may also be called clinical depression or unipolar depression. MDD usually causes feelings of sadness, hopelessness, or helplessness. MDD can also cause physical symptoms. It can interfere with work, school, relationships, and other everyday activities. MDD may be mild, moderate, or severe. It may occur once (single episode major depressive disorder) or it may occur multiple times (recurrent major depressive disorder). What are the causes? The exact cause of this condition is not known. MDD is most likely caused by a combination of things, which may include:  Genetic factors. These are traits that are passed along from parent to child.  Individual factors. Your personality, your behavior, and the way you handle your thoughts and feelings may contribute to MDD. This includes personality traits and behaviors learned from others.  Physical factors, such as: ? Differences in the part of your brain that controls emotion. This part of your brain may be different than it is in people who do not have MDD. ? Long-term (chronic) medical or psychiatric illnesses.  Social factors. Traumatic experiences or major life changes may  play a role in the development of MDD. What increases the risk? This condition is more likely to develop in women. The following factors may also make you more likely to develop MDD:  A family history of depression.  Troubled family relationships.  Abnormally low levels of certain brain chemicals.  Traumatic events in childhood, especially abuse or the loss of a parent.  Being under a lot of stress, or long-term stress, especially from upsetting life experiences or losses.  A history of: ? Chronic physical illness. ? Other mental health disorders. ? Substance abuse.  Poor living conditions.  Experiencing social exclusion or discrimination on a regular basis. What are the signs or symptoms? The main symptoms of MDD typically include:  Constant depressed or irritable mood.  Loss of interest in things and activities. MDD symptoms may also include:  Sleeping or eating too much or too little.  Unexplained weight change.  Fatigue or low energy.  Feelings of worthlessness or guilt.  Difficulty thinking clearly or making decisions.  Thoughts of suicide or of harming others.  Physical agitation or weakness.  Isolation. Severe cases of MDD may also occur with other symptoms, such as:  Delusions or hallucinations, in which you imagine things that are not real (psychotic depression).  Low-level depression that lasts at least a year (chronic depression or persistent depressive disorder).  Extreme sadness and hopelessness (melancholic depression).  Trouble speaking and moving (catatonic depression). How is this diagnosed? This condition may be diagnosed based on:  Your symptoms.  Your medical history, including your mental health history. This may involve tests to evaluate your mental health. You may be asked questions about your lifestyle, including any drug and alcohol use, and how long you have had symptoms of MDD.  A physical exam.  Blood tests to rule out other  conditions. You must have a depressed mood and at least four other MDD symptoms most of the day, nearly every day in the same 2-week timeframe before your health care provider can confirm a diagnosis of MDD. How is this treated? This condition is usually treated by mental health professionals, such as psychologists, psychiatrists, and clinical social workers. You may need more than one type of treatment. Treatment may include:  Psychotherapy. This is also called talk therapy or counseling. Types of psychotherapy include: ? Cognitive behavioral therapy (CBT). This type of therapy teaches you to recognize unhealthy feelings, thoughts, and behaviors, and replace them with positive thoughts and actions. ? Interpersonal therapy (IPT). This helps you to improve the way you relate to and communicate with others. ? Family therapy. This treatment includes members of your family.  Medicine to treat anxiety and depression, or to help you control certain emotions and behaviors.  Lifestyle changes, such as: ? Limiting alcohol and drug use. ? Exercising regularly. ? Getting plenty of sleep. ? Making healthy eating choices. ? Spending more time outdoors.  Treatments involving stimulation of the brain can be used in situations with extremely severe symptoms, or when medicine or other therapies do not work over time. These treatments include electroconvulsive therapy, transcranial magnetic stimulation, and vagal nerve stimulation. Follow these instructions at home: Activity  Return to your normal activities as told by  your health care provider.  Exercise regularly and spend time outdoors as told by your health care provider. General instructions  Take over-the-counter and prescription medicines only as told by your health care provider.  Do not drink alcohol. If you drink alcohol, limit your alcohol intake to no more than 1 drink a day for nonpregnant women and 2 drinks a day for men. One drink equals  12 oz of beer, 5 oz of wine, or 1 oz of hard liquor. Alcohol can affect any antidepressant medicines you are taking. Talk to your health care provider about your alcohol use.  Eat a healthy diet and get plenty of sleep.  Find activities that you enjoy doing, and make time to do them.  Consider joining a support group. Your health care provider may be able to recommend a support group.  Keep all follow-up visits as told by your health care provider. This is important. Where to find more information [employer] on Mental Illness  www.nami.org [place] National Institute of Mental Health  https://carter.com/ National Suicide Prevention Lifeline  1-800-273-TALK (204)185-0187). This is free, 24-hour help. Contact a health care provider if:  Your symptoms get worse.  You develop new symptoms. Get help right away if:  You self-harm.  You have serious thoughts about hurting yourself or others.  You see, hear, taste, smell, or feel things that are not present (hallucinate). This information is not intended to replace advice given to you by your health care provider. Make sure you discuss any questions you have with your health care provider. Document Revised: 11/17/2017 Document Reviewed: 06/15/2016 Elsevier Patient Education  2020 [employer].

## 2020-01-03 LAB — GC/CHLAMYDIA PROBE AMP
Chlamydia trachomatis, NAA: NEGATIVE
Neisseria Gonorrhoeae by PCR: NEGATIVE

## 2020-03-11 ENCOUNTER — Encounter: Payer: Self-pay | Admitting: Certified Nurse Midwife

## 2020-03-26 ENCOUNTER — Other Ambulatory Visit: Payer: Self-pay | Admitting: *Deleted

## 2020-03-26 MED ORDER — NORETHINDRONE 0.35 MG PO TABS: 1.0000 | ORAL_TABLET | Freq: Every day | ORAL | 0 refills | Status: DC

## 2020-03-26 NOTE — Telephone Encounter (Signed)
Medication refill request: Norethindrone  Last AEX:  06-27-2019 JJ  Next AEX: 07-02-20  Last MMG (if hormonal medication request): n/a Refill authorized: Today, please advise.   Medication pended for #3, 0RF. Please refill if appropriate.

## 2020-06-30 NOTE — Progress Notes (Deleted)
39 y.o. [id number] Single White or Caucasian Not Hispanic or Latino female here for annual exam.      No LMP recorded. (Menstrual status: Oral contraceptives).          Sexually active: {yes no:314532}  The current method of family planning is {contraception:315051}.    Exercising: {yes no:314532}  {types:19826} Smoker:  {YES [id number]  Health Maintenance: Pap: 03/01/2018 WNL POS HPV, 09-05-17 WNL 01-12-17 WNL History of abnormal Pap:  Yes: Colposcopy in 03/15/2018 was unsatisfactory, no abnormalities, ECC NEG, 02-15-16 ASCUS + HPV colposcopy 03-01-16 CIN-I VAIN II ECC neg  MMG:  None  BMD:   None  Colonoscopy: none  TDaP:  03/01/18 Gardasil: complete   reports that she has never smoked. She has never used smokeless tobacco. She reports that she does not drink alcohol and does not use drugs.  Past Medical History:  Diagnosis Date  . Anxiety   . Dysrhythmia    tachycardia with anxiety  . Environmental allergies   . GERD (gastroesophageal reflux disease)   . Hypertension   . Migraine   . Shortness of breath dyspnea    with anxiety  . Thyroid nodule    benign    Past Surgical History:  Procedure Laterality Date  . BIOPSY THYROID    . 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  J-plasma rep confirmed on 05/26/16.  Kennon Rounds notified  . CESAREAN SECTION    . COLPOSCOPY    . MOLE REMOVAL  02/2018   removed from back   . WISDOM TOOTH EXTRACTION      Current Outpatient Medications  Medication Sig Dispense Refill  . buPROPion (WELLBUTRIN XL) 150 MG 24 hr tablet Take 150 mg by mouth daily.   0  . busPIRone (BUSPAR) 15 MG tablet Take 15 mg by mouth 2 (two) times daily.     . cetirizine (ZYRTEC) 10 MG tablet Take 10 mg by mouth daily.    . cholecalciferol (VITAMIN D) 1000 units tablet  Take 1,000 Units by mouth daily.    Marland Kitchen EPINEPHrine (EPIPEN 2-PAK) 0.3 mg/0.3 mL IJ SOAJ injection Inject into the muscle once.    . famotidine (PEPCID) 20 MG tablet Take 20 mg by mouth.    Marland Kitchen ibuprofen (ADVIL) 800 MG tablet Take 1 tablet (800 mg total) by mouth every 8 (eight) hours as needed. 30 tablet 1  . LORazepam (ATIVAN) 0.5 MG tablet Take one tablet bid prn    . metoprolol (LOPRESSOR) 50 MG tablet Take 50 mg by mouth daily.    . montelukast (SINGULAIR) 10 MG tablet Take 10 mg by mouth at bedtime.   2  . Multiple Vitamin (MULTIVITAMIN) tablet Take 1 tablet by mouth daily.    . norethindrone (MICRONOR) 0.35 MG tablet Take 1 tablet (0.35 mg total) by mouth daily. 3 Package 0  . traZODone (DESYREL) 100 MG tablet Take 100-200 mg by mouth at bedtime. sleep     No current facility-administered medications for this visit.    Family History  Problem Relation Age of Onset  . Hypertension Mother   . Atrial fibrillation Mother   . Migraines Mother   . Migraines Sister   . Migraines Brother   . Dementia Maternal Grandmother   . Leukemia Maternal Grandfather   . Migraines Maternal Grandfather   .  Colon cancer Cousin   . Bone cancer Maternal Uncle     Review of Systems  Exam:   There were no vitals taken for this visit.  Weight change: @WEIGHTCHANGE @ Height:      Ht Readings from Last 3 Encounters:  01/02/20 5\' 2"  (1.575 m)  06/27/19 5' 3.39" (1.61 m)  08/30/18 5' 3.5" (1.613 m)    General appearance: alert, cooperative and appears stated age Head: Normocephalic, without obvious abnormality, atraumatic Neck: no adenopathy, supple, symmetrical, trachea midline and thyroid {CHL AMB PHY EX THYROID NORM DEFAULT:[phone]::"normal to inspection and palpation"} Lungs: clear to auscultation bilaterally Cardiovascular: regular rate and rhythm Breasts: {Exam; breast:13139::"normal appearance, no masses or tenderness"} Abdomen: soft, non-tender; non distended,  no masses,  no  organomegaly Extremities: extremities normal, atraumatic, no cyanosis or edema Skin: Skin color, texture, turgor normal. No rashes or lesions Lymph nodes: Cervical, supraclavicular, and axillary nodes normal. No abnormal inguinal nodes palpated Neurologic: Grossly normal   Pelvic: External genitalia:  no lesions              Urethra:  normal appearing urethra with no masses, tenderness or lesions              Bartholins and Skenes: normal                 Vagina: normal appearing vagina with normal color and discharge, no lesions              Cervix: {CHL AMB PHY EX CERVIX NORM [phone]::"no lesions"}               Bimanual Exam:  Uterus:  {CHL AMB PHY EX UTERUS NORM [phone]::"normal size, contour, position, consistency, mobility, non-tender"}              Adnexa: {CHL AMB PHY EX ADNEXA NO MASS DEFAULT:[phone]::"no mass, fullness, tenderness"}               Rectovaginal: Confirms               Anus:  normal sphincter tone, no lesions  *** chaperoned for the exam.  A:  Well Woman with normal exam  P:

## 2020-07-01 ENCOUNTER — Encounter: Payer: Self-pay | Admitting: Obstetrics and Gynecology

## 2020-07-02 ENCOUNTER — Ambulatory Visit: Payer: Self-pay | Admitting: Obstetrics and Gynecology

## 2020-11-06 ENCOUNTER — Other Ambulatory Visit: Payer: Self-pay

## 2020-11-06 MED ORDER — NORETHINDRONE 0.35 MG PO TABS: 1.0000 | ORAL_TABLET | Freq: Every day | ORAL | 0 refills | Status: DC

## 2020-11-06 NOTE — Telephone Encounter (Signed)
Patient is calling in regards to refill of birth control.  

## 2020-11-06 NOTE — Telephone Encounter (Signed)
Medication refill request: Micronor 0.35mg   Last AEX:  06/27/19 Next AEX: 11/20/20 Last MMG (if hormonal medication request): NA Refill authorized: 84/0

## 2020-11-20 ENCOUNTER — Encounter: Payer: Self-pay | Admitting: Obstetrics and Gynecology

## 2020-11-20 ENCOUNTER — Ambulatory Visit (INDEPENDENT_AMBULATORY_CARE_PROVIDER_SITE_OTHER): Payer: BLUE CROSS/BLUE SHIELD | Admitting: Obstetrics and Gynecology

## 2020-11-20 ENCOUNTER — Other Ambulatory Visit: Payer: Self-pay

## 2020-11-20 ENCOUNTER — Other Ambulatory Visit (HOSPITAL_COMMUNITY)
Admission: RE | Admit: 2020-11-20 | Discharge: 2020-11-20 | Disposition: A | Discharge status: Home / Self Care | Payer: BLUE CROSS/BLUE SHIELD | Source: Ambulatory Visit | Attending: Obstetrics and Gynecology | Admitting: Obstetrics and Gynecology

## 2020-11-20 VITALS — BP 100/66 | HR 70 | Resp 12 | Ht 62.0 in | Wt 152.5 lb

## 2020-11-20 DIAGNOSIS — Z1272 Encounter for screening for malignant neoplasm of vagina: Secondary | ICD-10-CM | POA: Insufficient documentation

## 2020-11-20 DIAGNOSIS — Z124 Encounter for screening for malignant neoplasm of cervix: Secondary | ICD-10-CM | POA: Diagnosis present

## 2020-11-20 DIAGNOSIS — Z01419 Encounter for gynecological examination (general) (routine) without abnormal findings: Secondary | ICD-10-CM | POA: Diagnosis not present

## 2020-11-20 DIAGNOSIS — Z3041 Encounter for surveillance of contraceptive pills: Secondary | ICD-10-CM

## 2020-11-20 MED ORDER — NORETHINDRONE 0.35 MG PO TABS
1.0000 | ORAL_TABLET | Freq: Every day | ORAL | 3 refills | Status: DC
Start: 2020-11-20 — End: 2021-12-28

## 2020-11-20 NOTE — Progress Notes (Signed)
39 y.o. [id number] Single White or Caucasian Not Hispanic or Latino female here for annual exam.  She has irregular cycles on POP. She has a cycle ~q3 months, brown d/c. Sexually active, same partner 4.5 years, don't live together.  Period Duration (Days): 7-10 Period Pattern: (!) Irregular Menstrual Flow: Light Menstrual Control: Panty liner Dysmenorrhea: (!) Mild Dysmenorrhea Symptoms: Cramping   She c/o a one month h/o increased fatigue. She had covid in 10/21. Child is 1, transgender female. He has struggled with depression, has been isolated (friends left, dad and GM don't talk with him). He wants to go on testosterone.   Patient's last menstrual period was 10/21/2020.          Sexually active: Yes.    The current method of family planning is POP (estrogen/progesterone).    Exercising: No.  The patient does not participate in regular exercise at present. Smoker:  no  Health Maintenance: Pap:  06-27-2019 negative, HR HPV negative           03-01-18 negative, HR HPV positive- ECC negative           09-05-17 negative, HPV negative  History of abnormal Pap:  Yes, in 2017 CIN I and VAIN II (treated with laser) Colonoscopy: 2021- 2 polyps- Dr. Roxanna Mew. No f/u needed.   TDaP:  03-01-18  Gardasil: completed all 3    reports that she has never smoked. She has never used smokeless tobacco. She reports that she does not drink alcohol and does not use drugs. She is a Equities trader.  Past Medical History:  Diagnosis Date  . Anxiety   . Dysrhythmia    tachycardia with anxiety  . Environmental allergies   . GERD (gastroesophageal reflux disease)   . Hypertension   . Migraine   . Shortness of breath dyspnea    with anxiety  . Thyroid nodule    benign    Past Surgical History:  Procedure Laterality Date  . BIOPSY THYROID    . CERVICAL CONIZATION W/BX N/A 06/02/2016   Procedure: J PLASMA LASER WITH REMOVAL OF VAGINAL DYSPLASIA;  Surgeon: Romualdo Bolk, MD;  Location: [facility];  Service:  Gynecology;  Laterality: N/A;  will use J-plasma.  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  J-plasma rep confirmed on 05/26/16.  Kennon Rounds notified  . CESAREAN SECTION    . COLPOSCOPY    . MOLE REMOVAL  02/2018   removed from back   . WISDOM TOOTH EXTRACTION      Current Outpatient Medications  Medication Sig Dispense Refill  . buPROPion (WELLBUTRIN XL) 150 MG 24 hr tablet Take 150 mg by mouth daily.   0  . busPIRone (BUSPAR) 15 MG tablet Take 15 mg by mouth 2 (two) times daily.     . cetirizine (ZYRTEC) 10 MG tablet Take 10 mg by mouth daily.    . cholecalciferol (VITAMIN D) 1000 units tablet Take 1,000 Units by mouth daily.    Marland Kitchen EPINEPHrine (EPIPEN 2-PAK) 0.3 mg/0.3 mL IJ SOAJ injection Inject into the muscle once.    . famotidine (PEPCID) 20 MG tablet Take 20 mg by mouth.    . fluticasone (FLONASE) 50 MCG/ACT nasal spray Place 2 sprays into both nostrils daily.    Marland Kitchen ibuprofen (ADVIL) 800 MG tablet Take 1 tablet (800 mg total) by mouth every 8 (eight) hours as needed. 30 tablet 1  . LORazepam (ATIVAN) 0.5 MG tablet Take one tablet bid prn    . metoprolol (LOPRESSOR) 50 MG tablet Take 50  mg by mouth daily.    . montelukast (SINGULAIR) 10 MG tablet Take 10 mg by mouth at bedtime.   2  . Multiple Vitamin (MULTIVITAMIN) tablet Take 1 tablet by mouth daily.    . norethindrone (MICRONOR) 0.35 MG tablet Take 1 tablet (0.35 mg total) by mouth daily. 84 tablet 0  . omeprazole (PRILOSEC) 20 MG capsule Take by mouth.    . polyethylene glycol powder (GLYCOLAX/MIRALAX) 17 GM/SCOOP powder Take by mouth.    . traZODone (DESYREL) 100 MG tablet Take 100-200 mg by mouth at bedtime. sleep     No current facility-administered medications for this visit.    Family History  Problem Relation Age of Onset  . Hypertension Mother   . Atrial fibrillation Mother   . Migraines Mother   . Migraines Sister   . Migraines Brother   . Dementia Maternal Grandmother   . Leukemia Maternal Grandfather   . Migraines Maternal  Grandfather   . Colon cancer Cousin   . Bone cancer Maternal Uncle     Review of Systems  Constitutional: Positive for fatigue.  All other systems reviewed and are negative.   Exam:   BP 100/66 (BP Location: Right Arm, Patient Position: Sitting, Cuff Size: Normal)   Pulse 70   Resp 12   Ht 5\' 2"  (1.575 m)   Wt 152 lb 8 oz (69.2 kg)   LMP 10/21/2020   BMI 27.89 kg/m   Weight change: @WEIGHTCHANGE @ Height:   Height: 5\' 2"  (157.5 cm)  Ht Readings from Last 3 Encounters:  11/20/20 5\' 2"  (1.575 m)  01/02/20 5\' 2"  (1.575 m)  06/27/19 5' 3.39" (1.61 m)    General appearance: alert, cooperative and appears stated age Head: Normocephalic, without obvious abnormality, atraumatic Neck: no adenopathy, supple, symmetrical, trachea midline and thyroid normal to inspection and palpation Lungs: clear to auscultation bilaterally Cardiovascular: regular rate and rhythm Breasts: normal appearance, no masses or tenderness Abdomen: soft, non-tender; non distended,  no masses,  no organomegaly Extremities: extremities normal, atraumatic, no cyanosis or edema Skin: Skin color, texture, turgor normal. No rashes or lesions Lymph nodes: Cervical, supraclavicular, and axillary nodes normal. No abnormal inguinal nodes palpated Neurologic: Grossly normal   Pelvic: External genitalia:  no lesions              Urethra:  normal appearing urethra with no masses, tenderness or lesions              Bartholins and Skenes: normal                 Vagina: normal appearing vagina with normal color and discharge, no lesions              Cervix: no lesions               Bimanual Exam:  Uterus:  normal size, contour, position, consistency, mobility, non-tender              Adnexa: no mass, fullness, tenderness               Rectovaginal: Confirms               Anus:  normal sphincter tone, no lesions  14/03/21 chaperoned for the exam.  A:  Well Woman with normal exam  H/o cervical and vaginal  dysplasia, normal pap last year  On POP, doing well  Fatigue, started with covid infection. She will f/u with her primary if it doesn't improve  P:  Pap with hpv  Discussed breast self exam  Discussed calcium and vit D intake  Mammogram next year  Continue POP  Discussed breast self exam  Discussed calcium and vit D intake

## 2020-11-20 NOTE — Patient Instructions (Signed)
EXERCISE   We recommended that you start or continue a regular exercise program for good health. Physical activity is anything that gets your body moving, some is better than none. The CDC recommends 150 minutes per week of Moderate-Intensity Aerobic Activity and 2 or more days of Muscle Strengthening Activity.  Benefits of exercise are limitless: helps weight loss/weight maintenance, improves mood and energy, helps with depression and anxiety, improves sleep, tones and strengthens muscles, improves balance, improves bone density, protects from chronic conditions such as heart disease, high blood pressure and diabetes and so much more. To learn more visit: https://www.cdc.gov/physicalactivity/index.html  DIET: Good nutrition starts with a healthy diet of fruits, vegetables, whole grains, and lean protein sources. Drink plenty of water for hydration. Minimize empty calories, sodium, sweets. For more information about dietary recommendations visit: https://health.gov/our-work/nutrition-physical-activity/dietary-guidelines and https://www.myplate.gov/  ALCOHOL:  Women should limit their alcohol intake to no more than 7 drinks/beers/glasses of wine (combined, not each!) per week. Moderation of alcohol intake to this level decreases your risk of breast cancer and liver damage.  If you are concerned that you may have a problem, or your friends have told you they are concerned about your drinking, there are many resources to help. A well-known program that is free, effective, and available to all people all over the nation is Alcoholics Anonymous.  Check out this site to learn more: https://www.aa.org/   CALCIUM AND VITAMIN D:  Adequate intake of calcium and Vitamin D are recommended for bone health.  The recommendations for exact amounts of these supplements seem to change often, but generally speaking 1000-1500 mg of calcium (between diet and supplement) and 800 units of Vitamin D per day seems prudent.      PAP SMEARS:  Pap smears, to check for cervical cancer or precancers,  have traditionally been done yearly, although recent scientific advances have shown that most women can have pap smears less often.  However, every woman still should have a physical exam from her gynecologist every year. It will include a breast check, inspection of the vulva and vagina to check for abnormal growths or skin changes, a visual exam of the cervix, and then an exam to evaluate the size and shape of the uterus and ovaries.  And after 39 years of age, a rectal exam is indicated to check for rectal cancers. We will also provide age appropriate advice regarding health maintenance, like when you should have certain vaccines, screening for sexually transmitted diseases, bone density testing, colonoscopy, mammograms, etc.   MAMMOGRAMS:  All women over 39 years old should have a routine mammogram.   COLON CANCER SCREENING: Now recommend starting at age 39. At this time colonoscopy is not covered for routine screening until 50. There are take home tests that can be done between 45-49.   COLONOSCOPY:  Colonoscopy to screen for colon cancer is recommended for all women at age 39.  We know, you hate the idea of the prep.  We agree, BUT, having colon cancer and not knowing it is worse!!  Colon cancer so often starts as a polyp that can be seen and removed at colonscopy, which can quite literally save your life!  And if your first colonoscopy is normal and you have no family history of colon cancer, most women don't have to have it again for 10 years.  Once every ten years, you can do something that may end up saving your life, right?  We will be happy to help you get it scheduled when   you are ready.  Be sure to check your insurance coverage so you understand how much it will cost.  It may be covered as a preventative service at no cost, but you should check your particular policy.      Breast Self-Awareness Breast self-awareness  means being familiar with how your breasts look and feel. It involves checking your breasts regularly and reporting any changes to your health care provider. Practicing breast self-awareness is important. A change in your breasts can be a sign of a serious medical problem. Being familiar with how your breasts look and feel allows you to find any problems early, when treatment is more likely to be successful. All women should practice breast self-awareness, including women who have had breast implants. How to do a breast self-exam One way to learn what is normal for your breasts and whether your breasts are changing is to do a breast self-exam. To do a breast self-exam: Look for Changes  1. Remove all the clothing above your waist. 2. Stand in front of a mirror in a room with good lighting. 3. Put your hands on your hips. 4. Push your hands firmly downward. 5. Compare your breasts in the mirror. Look for differences between them (asymmetry), such as: ? Differences in shape. ? Differences in size. ? Puckers, dips, and bumps in one breast and not the other. 6. Look at each breast for changes in your skin, such as: ? Redness. ? Scaly areas. 7. Look for changes in your nipples, such as: ? Discharge. ? Bleeding. ? Dimpling. ? Redness. ? A change in position. Feel for Changes Carefully feel your breasts for lumps and changes. It is best to do this while lying on your back on the floor and again while sitting or standing in the shower or tub with soapy water on your skin. Feel each breast in the following way:  Place the arm on the side of the breast you are examining above your head.  Feel your breast with the other hand.  Start in the nipple area and make  inch (2 cm) overlapping circles to feel your breast. Use the pads of your three middle fingers to do this. Apply light pressure, then medium pressure, then firm pressure. The light pressure will allow you to feel the tissue closest to the  skin. The medium pressure will allow you to feel the tissue that is a little deeper. The firm pressure will allow you to feel the tissue close to the ribs.  Continue the overlapping circles, moving downward over the breast until you feel your ribs below your breast.  Move one finger-width toward the center of the body. Continue to use the  inch (2 cm) overlapping circles to feel your breast as you move slowly up toward your collarbone.  Continue the up and down exam using all three pressures until you reach your armpit.  Write Down What You Find  Write down what is normal for each breast and any changes that you find. Keep a written record with breast changes or normal findings for each breast. By writing this information down, you do not need to depend only on memory for size, tenderness, or location. Write down where you are in your menstrual cycle, if you are still menstruating. If you are having trouble noticing differences in your breasts, do not get discouraged. With time you will become more familiar with the variations in your breasts and more comfortable with the exam. How often should I examine   my breasts? Examine your breasts every month. If you are breastfeeding, the best time to examine your breasts is after a feeding or after using a breast pump. If you menstruate, the best time to examine your breasts is 5-7 days after your period is over. During your period, your breasts are lumpier, and it may be more difficult to notice changes. When should I see my health care provider? See your health care provider if you notice:  A change in shape or size of your breasts or nipples.  A change in the skin of your breast or nipples, such as a reddened or scaly area.  Unusual discharge from your nipples.  A lump or thick area that was not there before.  Pain in your breasts.  Anything that concerns you.  

## 2020-11-23 LAB — CYTOLOGY - PAP
Comment: NEGATIVE
Diagnosis: NEGATIVE
High risk HPV: NEGATIVE

## 2021-11-25 NOTE — Progress Notes (Signed)
Patient not seen.

## 2021-12-01 ENCOUNTER — Ambulatory Visit (INDEPENDENT_AMBULATORY_CARE_PROVIDER_SITE_OTHER): Payer: BLUE CROSS/BLUE SHIELD | Admitting: Obstetrics and Gynecology

## 2021-12-01 ENCOUNTER — Other Ambulatory Visit: Payer: Self-pay

## 2021-12-24 DIAGNOSIS — F330 Major depressive disorder, recurrent, mild: Secondary | ICD-10-CM | POA: Insufficient documentation

## 2021-12-28 ENCOUNTER — Other Ambulatory Visit: Payer: Self-pay | Admitting: *Deleted

## 2021-12-28 MED ORDER — NORETHINDRONE 0.35 MG PO TABS: 1.0000 | ORAL_TABLET | Freq: Every day | ORAL | 0 refills | Status: DC

## 2021-12-28 NOTE — Telephone Encounter (Signed)
Annual exam scheduled on 02/17/22 Last annual exam was 11/2020

## 2022-02-09 NOTE — Progress Notes (Deleted)
41 y.o. [id number] Single White or Caucasian Not Hispanic or Latino female here for annual exam.   ?  ? ?No LMP recorded. (Menstrual status: Oral contraceptives).          ?Sexually active: {yes no:314532}  ?The current method of family planning is {contraception:315051}.    ?Exercising: {yes no:314532}  {types:19826} ?Smoker:  {YES NO:22349} ? ?Health Maintenance: ?Pap:  11/20/20 WNL Hr HPV Neg  ? 06-27-2019 negative, HR HPV negative  ?         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 ? reports that she has never smoked. She has never used smokeless tobacco. She reports that she does not drink alcohol and does not use drugs. ? ?Past Medical History:  ?Diagnosis Date  ? Anxiety   ? Dysrhythmia   ? tachycardia with anxiety  ? Environmental allergies   ? GERD (gastroesophageal reflux disease)   ? Hypertension   ? Migraine   ? Shortness of breath dyspnea   ? with anxiety  ? Thyroid nodule   ? benign  ? ? ?Past Surgical History:  ?Procedure Laterality Date  ? BIOPSY THYROID    ? CERVICAL CONIZATION W/BX N/A 06/02/2016  ? Procedure: 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  J-plasma rep confirmed on 05/26/16.  Kennon Rounds notified  ? CESAREAN SECTION    ? COLPOSCOPY    ? MOLE REMOVAL  02/2018  ? removed from back   ? WISDOM TOOTH EXTRACTION    ? ? ?Current Outpatient Medications  ?Medication Sig Dispense Refill  ? buPROPion (WELLBUTRIN XL) 150 MG 24 hr tablet Take 150 mg by mouth daily.   0  ? busPIRone (BUSPAR) 15 MG tablet Take 15 mg by mouth 2 (two) times daily.     ? cetirizine (ZYRTEC) 10 MG tablet Take 10 mg by mouth daily.    ? cholecalciferol (VITAMIN D) 1000 units tablet Take 1,000 Units  by mouth daily.    ? EPINEPHrine (EPIPEN 2-PAK) 0.3 mg/0.3 mL IJ SOAJ injection Inject into the muscle once.    ? famotidine (PEPCID) 20 MG tablet Take 20 mg by mouth.    ? fluticasone (FLONASE) 50 MCG/ACT nasal spray Place 2 sprays into both nostrils daily.    ? ibuprofen (ADVIL) 800 MG tablet Take 1 tablet (800 mg total) by mouth every 8 (eight) hours as needed. 30 tablet 1  ? LORazepam (ATIVAN) 0.5 MG tablet Take one tablet bid prn    ? metoprolol (LOPRESSOR) 50 MG tablet Take 50 mg by mouth daily.    ? montelukast (SINGULAIR) 10 MG tablet Take 10 mg by mouth at bedtime.   2  ? Multiple Vitamin (MULTIVITAMIN) tablet Take 1 tablet by mouth daily.    ? norethindrone (MICRONOR) 0.35 MG tablet Take 1 tablet (0.35 mg total) by mouth daily. 84 tablet 0  ? omeprazole (PRILOSEC) 20 MG capsule Take by mouth.    ? polyethylene glycol powder (GLYCOLAX/MIRALAX) 17 GM/SCOOP powder Take by mouth.    ? traZODone (DESYREL) 100 MG tablet Take 100-200 mg by mouth at bedtime. sleep    ? ?No current facility-administered  medications for this visit.  ? ? ?Family History  ?Problem Relation Age of Onset  ? Hypertension Mother   ? Atrial fibrillation Mother   ? Migraines Mother   ? Migraines Sister   ? Migraines Brother   ? Dementia Maternal Grandmother   ? Leukemia Maternal Grandfather   ? Migraines Maternal Grandfather   ? Colon cancer Cousin   ? Bone cancer Maternal Uncle   ? ? ?Review of Systems ? ?Exam:   ?There were no vitals taken for this visit.  Weight change: @WEIGHTCHANGE @ Height:      ?Ht Readings from Last 3 Encounters:  ?11/20/20 5\' 2"  (1.575 m)  ?01/02/20 5\' 2"  (1.575 m)  ?06/27/19 5' 3.39" (1.61 m)  ? ? ?General appearance: alert, cooperative and appears stated age ?Head: Normocephalic, without obvious abnormality, atraumatic ?Neck: no adenopathy, supple, symmetrical, trachea midline and thyroid {CHL AMB PHY EX THYROID NORM [phone]::"normal to inspection and palpation"} ?Lungs: clear to auscultation  bilaterally ?Cardiovascular: regular rate and rhythm ?Breasts: {Exam; breast:13139::"normal appearance, no masses or tenderness"} ?Abdomen: soft, non-tender; non distended,  no masses,  no organomegaly ?Extremities: extremities normal, atraumatic, no cyanosis or edema ?Skin: Skin color, texture, turgor normal. No rashes or lesions ?Lymph nodes: Cervical, supraclavicular, and axillary nodes normal. ?No abnormal inguinal nodes palpated ?Neurologic: Grossly normal ? ? ?Pelvic: External genitalia:  no lesions ?             Urethra:  normal appearing urethra with no masses, tenderness or lesions ?             Bartholins and Skenes: normal    ?             Vagina: normal appearing vagina with normal color and discharge, no lesions ?             Cervix: {CHL AMB PHY EX CERVIX NORM DEFAULT:[phone]::"no lesions"} ?              ?Bimanual Exam:  Uterus:  {CHL AMB PHY EX UTERUS NORM DEFAULT:[phone]::"normal size, contour, position, consistency, mobility, non-tender"} ?             Adnexa: {CHL AMB PHY EX ADNEXA NO MASS DEFAULT:[phone]::"no mass, fullness, tenderness"} ?              Rectovaginal: Confirms ?              Anus:  normal sphincter tone, no lesions ? ?*** chaperoned for the exam. ? ?A:  Well Woman with normal exam ? ?P:    ? ? ? ?

## 2022-02-17 ENCOUNTER — Ambulatory Visit: Payer: BLUE CROSS/BLUE SHIELD | Admitting: Obstetrics and Gynecology

## 2022-04-15 ENCOUNTER — Other Ambulatory Visit: Payer: Self-pay

## 2022-04-15 MED ORDER — NORETHINDRONE 0.35 MG PO TABS: 1.0000 | ORAL_TABLET | Freq: Every day | ORAL | 0 refills | Status: DC

## 2022-04-15 NOTE — Telephone Encounter (Signed)
Pt notified and voiced understanding 

## 2022-04-15 NOTE — Telephone Encounter (Signed)
Pt also LVM inquiring about refill. ? ?Last AEX 11/20/20--scheduled 04/27/22. ?No mammo seen.  ?

## 2022-04-20 NOTE — Progress Notes (Signed)
40 y.o. [id number] Single White or Caucasian Not Hispanic or Latino female here for annual exam.  She is on POP, she has intermittent spotting for a week. The spotting occurs every 2-3 months. Sexually active, same partner x 3 months. No dyspareunia.  ? ?She has constipation, helped with miralax. No bladder c/o.  ? ?Wants to get set up for a mammogram.  ?  ? ?No LMP recorded. (Menstrual status: Oral contraceptives).          ?Sexually active: Yes.    ?The current method of family planning is oral progesterone-only contraceptive.    ?Exercising: No.  The patient does not participate in regular exercise at present. ?Smoker:  no ? 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 Roxanna Mew. No f/u needed.   ?TDaP:  03/01/18  ?Gardasil: Complete  ? ? reports that she has never smoked. She has never used smokeless tobacco. She reports that she does not drink alcohol and does not use drugs. She is a Equities trader. Daughter is 27, she was transgender, no longer transgender, h/o anxiety/depression.  ? ?Past Medical History:  ?Diagnosis Date  ? Anxiety   ? Dysrhythmia   ? tachycardia with anxiety  ? Environmental allergies   ? GERD (gastroesophageal reflux disease)   ? Hypertension   ? Migraine   ? Shortness of breath dyspnea   ? with anxiety  ? Thyroid nodule   ? benign  ? ? ?Past Surgical History:  ?Procedure Laterality Date  ? BIOPSY THYROID    ? CERVICAL CONIZATION W/BX N/A 06/02/2016  ? Procedure: 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  J-plasma rep confirmed on 05/26/16.  Kennon Rounds notified  ? CESAREAN SECTION    ? COLPOSCOPY    ? MOLE REMOVAL  02/2018  ? removed from back   ? WISDOM TOOTH EXTRACTION     ? ? ?Current Outpatient Medications  ?Medication Sig Dispense Refill  ? buPROPion (WELLBUTRIN XL) 150 MG 24 hr tablet Take 150 mg by mouth daily.   0  ? busPIRone (BUSPAR) 15 MG tablet Take 15 mg by mouth 2 (two) times daily.     ? cetirizine (ZYRTEC) 10 MG tablet Take 10 mg by mouth daily.    ? cholecalciferol (VITAMIN D) 1000 units tablet Take 1,000 Units by mouth daily.    ? EPINEPHrine 0.3 mg/0.3 mL IJ SOAJ injection Inject into the muscle once.    ? famotidine (PEPCID) 20 MG tablet Take 20 mg by mouth.    ? fluticasone (FLONASE) 50 MCG/ACT nasal spray Place 2 sprays into both nostrils daily.    ? ibuprofen (ADVIL) 800 MG tablet Take 1 tablet (800 mg total) by mouth every 8 (eight) hours as needed. 30 tablet 1  ? LORazepam (ATIVAN) 0.5 MG tablet Take one tablet bid prn    ? metoprolol (LOPRESSOR) 50 MG tablet Take 50 mg by mouth daily.    ? montelukast (SINGULAIR) 10 MG tablet Take 10 mg by mouth at bedtime.   2  ? Multiple Vitamin (MULTIVITAMIN) tablet Take 1 tablet by mouth daily.    ? norethindrone (MICRONOR) 0.35 MG tablet Take 1  tablet (0.35 mg total) by mouth daily. 28 tablet 0  ? omeprazole (PRILOSEC) 20 MG capsule Take by mouth.    ? polyethylene glycol powder (GLYCOLAX/MIRALAX) 17 GM/SCOOP powder Take by mouth.    ? traZODone (DESYREL) 100 MG tablet Take 100-200 mg by mouth at bedtime. sleep    ? ?No current facility-administered medications for this visit.  ? ? ?Family History  ?Problem Relation Age of Onset  ? Hypertension Mother   ? Atrial fibrillation Mother   ? Migraines Mother   ? Migraines Sister   ? Migraines Brother   ? Dementia Maternal Grandmother   ? Leukemia Maternal Grandfather   ? Migraines Maternal Grandfather   ? Colon cancer Cousin   ? Bone cancer Maternal Uncle   ? ? ?Review of Systems  ?All other systems reviewed and are negative. ? ?Exam:   ?BP 110/64   Pulse 76   Ht 5\' 2"  (1.575 m)   Wt 142 lb (64.4 kg)   SpO2 98%   BMI 25.97 kg/m?   Weight change: @WEIGHTCHANGE @ Height:    Height: 5\' 2"  (157.5 cm)  ?Ht Readings from Last 3 Encounters:  ?04/27/22 5\' 2"  (1.575 m)  ?11/20/20 5\' 2"  (1.575 m)  ?01/02/20 5\' 2"  (1.575 m)  ? ? ?General appearance: alert, cooperative and appears stated age ?Head: Normocephalic, without obvious abnormality, atraumatic ?Neck: no adenopathy, supple, symmetrical, trachea midline and thyroid normal to inspection and palpation ?Lungs: clear to auscultation bilaterally ?Cardiovascular: regular rate and rhythm ?Breasts: normal appearance, no masses or tenderness ?Abdomen: soft, non-tender; non distended,  no masses,  no organomegaly ?Extremities: extremities normal, atraumatic, no cyanosis or edema ?Skin: Skin color, texture, turgor normal. No rashes or lesions ?Lymph nodes: Cervical, supraclavicular, and axillary nodes normal. ?No abnormal inguinal nodes palpated ?Neurologic: Grossly normal ? ? ?Pelvic: External genitalia:  no lesions ?             Urethra:  normal appearing urethra with no masses, tenderness or lesions ?             Bartholins and Skenes: normal    ?             Vagina: normal appearing vagina with normal color and discharge, no lesions ?             Cervix: no lesions ?              ?Bimanual Exam:  Uterus:  normal size, contour, position, consistency, mobility, non-tender ?             Adnexa: no mass, fullness, tenderness ?              Rectovaginal: Confirms ?              Anus:  normal sphincter tone, no lesions ? ?06/27/22 chaperoned for the exam. ? ?1. Well woman exam ?Discussed breast self exam ?Discussed calcium and vit D intake ?CBC and CMP are UTD ?Mammogram # given ? ?2. Encounter for surveillance of contraceptive pills ?Doing well ?- norethindrone (MICRONOR) 0.35 MG tablet; Take 1 tablet (0.35 mg total) by mouth daily.  Dispense: 84 tablet; Refill: 3 ? ?3. Elevated LDL cholesterol level ?- Lipid panel ? ?4. Screening examination for STD (sexually transmitted disease) ?- SURESWAB CT/NG/T. vaginalis ?- RPR ?- HIV Antibody (routine  testing w rflx) ? ?5. Screening for cervical cancer ?H/O CIN and VAIN ?- Cytology - PAP ? ? ?

## 2022-04-27 ENCOUNTER — Ambulatory Visit (INDEPENDENT_AMBULATORY_CARE_PROVIDER_SITE_OTHER): Payer: 59 | Admitting: Obstetrics and Gynecology

## 2022-04-27 ENCOUNTER — Other Ambulatory Visit (HOSPITAL_COMMUNITY)
Admission: RE | Admit: 2022-04-27 | Discharge: 2022-04-27 | Disposition: A | Discharge status: Home / Self Care | Payer: 59 | Source: Ambulatory Visit | Attending: Obstetrics and Gynecology | Admitting: Obstetrics and Gynecology

## 2022-04-27 ENCOUNTER — Encounter: Payer: Self-pay | Admitting: Obstetrics and Gynecology

## 2022-04-27 VITALS — BP 110/64 | HR 76 | Ht 62.0 in | Wt 142.0 lb

## 2022-04-27 DIAGNOSIS — Z113 Encounter for screening for infections with a predominantly sexual mode of transmission: Secondary | ICD-10-CM | POA: Diagnosis not present

## 2022-04-27 DIAGNOSIS — Z3041 Encounter for surveillance of contraceptive pills: Secondary | ICD-10-CM | POA: Diagnosis not present

## 2022-04-27 DIAGNOSIS — Z01419 Encounter for gynecological examination (general) (routine) without abnormal findings: Secondary | ICD-10-CM | POA: Diagnosis not present

## 2022-04-27 DIAGNOSIS — Z124 Encounter for screening for malignant neoplasm of cervix: Secondary | ICD-10-CM | POA: Insufficient documentation

## 2022-04-27 DIAGNOSIS — E7800 Pure hypercholesterolemia, unspecified: Secondary | ICD-10-CM | POA: Diagnosis not present

## 2022-04-27 MED ORDER — NORETHINDRONE 0.35 MG PO TABS: 1.0000 | ORAL_TABLET | Freq: Every day | ORAL | 3 refills | Status: DC

## 2022-04-27 NOTE — Patient Instructions (Signed)

## 2022-04-28 LAB — LIPID PANEL
Cholesterol: 175 mg/dL (ref <200)
HDL: 45 mg/dL — ABNORMAL LOW (ref >=50)
LDL Cholesterol (Calc): 100 mg/dL (calc) — ABNORMAL HIGH
Non-HDL Cholesterol (Calc): 130 mg/dL (calc) — ABNORMAL HIGH (ref <130)
Total CHOL/HDL Ratio: 3.9 (calc) (ref <5.0)
Triglycerides: 179 mg/dL — ABNORMAL HIGH (ref <150)

## 2022-04-28 LAB — HIV ANTIBODY (ROUTINE TESTING W REFLEX): HIV 1&2 Ab, 4th Generation: NONREACTIVE

## 2022-04-28 LAB — RPR: RPR Ser Ql: NONREACTIVE

## 2022-04-28 LAB — SURESWAB CT/NG/T. VAGINALIS
C. trachomatis RNA, TMA: NOT DETECTED
N. gonorrhoeae RNA, TMA: NOT DETECTED
Trichomonas vaginalis RNA: NOT DETECTED

## 2022-05-02 LAB — CYTOLOGY - PAP
Comment: NEGATIVE
Diagnosis: NEGATIVE
High risk HPV: POSITIVE — AB

## 2022-05-03 ENCOUNTER — Other Ambulatory Visit: Payer: Self-pay

## 2022-05-03 DIAGNOSIS — R87810 Cervical high risk human papillomavirus (HPV) DNA test positive: Secondary | ICD-10-CM

## 2022-05-24 NOTE — Progress Notes (Unsigned)
GYNECOLOGY  VISIT   HPI: 41 y.o.   Single White or Caucasian Not Hispanic or Latino  female   [phone] with No LMP recorded. (Menstrual status: Oral contraceptives).   here for   colposcopy. PAP: 04-27-22 neg HPV HR + Her pap from 12/21 was normal, neg hpv and her pap from 7/20 was negative with negative hpv. 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  Allergy testing done years ago showed an allergy to shellfish. She has never eaten shellfish. She has tolerated lugols vaginally in the past without any reaction.   GYNECOLOGIC HISTORY: No LMP recorded. (Menstrual status: Oral contraceptives). 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   Abnormal Pap smear of cervix 10/17/2017   Anxiety 10/17/2017   Dysmenorrhea 10/17/2017   Menorrhagia 10/17/2017   Panic attacks 10/05/2017   Environmental allergies    Migraines 02/15/2016   Hypertension 02/15/2016   GAD (generalized anxiety disorder) 06/04/2015   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 06/04/2015   Insomnia 07/17/2014   Fatigue 06/05/2014   Memory loss 06/05/2014   Visual disturbance 06/05/2014    Past Medical History:  Diagnosis Date   Anxiety    Dysrhythmia    tachycardia with anxiety   Environmental allergies    GERD (gastroesophageal reflux disease)    Hypertension    Migraine    Shortness of breath dyspnea    with anxiety   Thyroid nodule    benign    Past Surgical History:  Procedure Laterality Date   BIOPSY THYROID     CERVICAL CONIZATION W/BX N/A 06/02/2016   Procedure: J PLASMA LASER WITH REMOVAL OF VAGINAL DYSPLASIA;  Surgeon: Salvadore Dom, MD;  Location: [facility];  Service: Gynecology;  Laterality: N/A;  will use J-plasma.  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  J-plasma rep confirmed on 05/26/16.  Sally notified   CESAREAN SECTION     COLPOSCOPY     MOLE REMOVAL  02/2018   removed from back    WISDOM TOOTH EXTRACTION      Current Outpatient Medications  Medication Sig Dispense Refill   buPROPion (WELLBUTRIN XL) 300 MG 24 hr tablet Take 300 mg by mouth daily.     busPIRone (BUSPAR) 15 MG tablet Take 15 mg by mouth 2 (two) times daily.      cetirizine (ZYRTEC) 10 MG tablet Take 10 mg by mouth daily.     famotidine (PEPCID) 20 MG tablet Take 20 mg by mouth.     ibuprofen (ADVIL) 800 MG tablet Take 1 tablet (800 mg total) by mouth every 8 (eight) hours as needed. 30 tablet 1   LORazepam (ATIVAN) 0.5 MG tablet Take one tablet bid prn     metoprolol (LOPRESSOR) 50 MG tablet Take 50 mg by mouth daily.     montelukast (SINGULAIR) 10 MG tablet Take 10  mg by mouth at bedtime.   2   Multiple Vitamin (MULTIVITAMIN) tablet Take 1 tablet by mouth daily.     norethindrone (MICRONOR) 0.35 MG tablet Take 1 tablet (0.35 mg total) by mouth daily. 84 tablet 3   omeprazole (PRILOSEC) 20 MG capsule Take by mouth.     polyethylene glycol powder (GLYCOLAX/MIRALAX) 17 GM/SCOOP powder Take by mouth.     traZODone (DESYREL) 100 MG tablet Take 100-200 mg by mouth at bedtime. sleep     EPINEPHrine 0.3 mg/0.3 mL IJ SOAJ injection Inject into the muscle once. (Patient not taking: Reported on 05/25/2022)     No current facility-administered medications for this visit.     ALLERGIES: Cantaloupe (diagnostic), Chocolate, Citrus, Daucus carota, Fish allergy, Other, Rice, Tomato, Vanilla, Mustard [allyl isothiocyanate], Peanut-containing drug products, Shellfish allergy, and Soy allergy  Family History  Problem Relation Age of Onset   Hypertension Mother    Atrial fibrillation Mother    Migraines Mother    Migraines Sister    Migraines Brother     Dementia Maternal Grandmother    Leukemia Maternal Grandfather    Migraines Maternal Grandfather    Colon cancer Cousin    Bone cancer Maternal Uncle     Social History   Socioeconomic History   Marital status: Single    Spouse name: Not on file   Number of children: Not on file   Years of education: Not on file   Highest education level: Not on file  Occupational History   Not on file  Tobacco Use   Smoking status: Never   Smokeless tobacco: Never  Vaping Use   Vaping Use: Never used  Substance and Sexual Activity   Alcohol use: No    Alcohol/week: 0.0 standard drinks   Drug use: No   Sexual activity: Yes    Partners: Male    Birth control/protection: Pill  Other Topics Concern   Not on file  Social History Narrative   Not on file   Social Determinants of Health   Financial Resource Strain: Not on file  Food Insecurity: Not on file  Transportation Needs: Not on file  Physical Activity: Not on file  Stress: Not on file  Social Connections: Not on file  Intimate Partner Violence: Not on file    Review of Systems  Constitutional: Negative.   HENT: Negative.    Eyes: Negative.   Respiratory: Negative.    Cardiovascular: Negative.   Gastrointestinal: Negative.   Genitourinary: Negative.   Musculoskeletal: Negative.   Skin: Negative.   Neurological: Negative.   Endo/Heme/Allergies: Negative.   Psychiatric/Behavioral: Negative.     PHYSICAL EXAMINATION:    BP 98/64   Pulse 70   Wt 138 lb (62.6 kg)   SpO2 97%   BMI 25.24 kg/m     General appearance: alert, cooperative and appears stated age   Pelvic: External genitalia:  no lesions              Urethra:  normal appearing urethra with no masses, tenderness or lesions              Bartholins and Skenes: normal                 Vagina: normal appearing vagina with normal color and discharge, no lesions              Cervix: no lesions and stenotic  Colposcopy: unsatisfactory, no aceto-white changes,  negative lugols examination of the cervix and upper vagina.  The cervix was stenotic, needed to place a tenaculum and dilate the cervix in order to perform the ECC. Cervix dilated and ECC done. Tenaculum was removed. Saline was used to wipe off the cervix and upper vagina.   She was cautioned about risk of allergy, she will take benadryl if needed, call with any reaction.                Chaperone, Royal Hawthorn, CMA was present for exam.  1. Cervical high risk HPV (human papillomavirus) test positive - Colposcopy - Surgical pathology( [place]/ [facility])  2. History of vaginal dysplasia  3. Cervical stenosis (uterine cervix) Needed to dilate her cervix  4. Encounter for preprocedural laboratory examination - Pregnancy, urine

## 2022-05-25 ENCOUNTER — Encounter: Payer: Self-pay | Admitting: Obstetrics and Gynecology

## 2022-05-25 ENCOUNTER — Ambulatory Visit: Payer: 59 | Admitting: Obstetrics and Gynecology

## 2022-05-25 ENCOUNTER — Other Ambulatory Visit (HOSPITAL_COMMUNITY)
Admission: RE | Admit: 2022-05-25 | Discharge: 2022-05-25 | Disposition: A | Discharge status: Home / Self Care | Payer: 59 | Source: Ambulatory Visit | Attending: Obstetrics and Gynecology | Admitting: Obstetrics and Gynecology

## 2022-05-25 VITALS — BP 98/64 | HR 70 | Wt 138.0 lb

## 2022-05-25 DIAGNOSIS — Z87411 Personal history of vaginal dysplasia: Secondary | ICD-10-CM | POA: Diagnosis not present

## 2022-05-25 DIAGNOSIS — R87810 Cervical high risk human papillomavirus (HPV) DNA test positive: Secondary | ICD-10-CM

## 2022-05-25 DIAGNOSIS — Z01812 Encounter for preprocedural laboratory examination: Secondary | ICD-10-CM

## 2022-05-25 DIAGNOSIS — N882 Stricture and stenosis of cervix uteri: Secondary | ICD-10-CM

## 2022-05-25 LAB — PREGNANCY, URINE: Preg Test, Ur: NEGATIVE

## 2022-05-25 NOTE — Patient Instructions (Signed)

## 2022-05-26 LAB — SURGICAL PATHOLOGY

## 2023-06-07 ENCOUNTER — Other Ambulatory Visit: Payer: Self-pay

## 2023-06-07 DIAGNOSIS — Z3041 Encounter for surveillance of contraceptive pills: Secondary | ICD-10-CM

## 2023-06-07 MED ORDER — NORETHINDRONE 0.35 MG PO TABS: 1.0000 | ORAL_TABLET | Freq: Every day | ORAL | 0 refills | Status: DC

## 2023-06-07 NOTE — Telephone Encounter (Signed)
Medication refill request: errin 0.35mg  Last AEX:  04-27-22 Next AEX: not scheduled Last MMG (if hormonal medication request): n/a Refill authorized: please approve 1 mth is appropriate. Pharmacy note placed for patient to call to schedule yearly exam for further refills.

## 2023-10-03 ENCOUNTER — Other Ambulatory Visit: Payer: Self-pay

## 2023-10-03 DIAGNOSIS — Z3041 Encounter for surveillance of contraceptive pills: Secondary | ICD-10-CM

## 2023-10-03 NOTE — Telephone Encounter (Signed)
Med refill request: norethindrone 0.35mg  Last AEX: 04/27/22 Dr. Oscar La Next AEX: none scheduled Last MMG (if hormonal med) none Refill sent to provider for denial.

## 2023-10-09 ENCOUNTER — Other Ambulatory Visit: Payer: Self-pay | Admitting: *Deleted

## 2023-10-09 DIAGNOSIS — Z3041 Encounter for surveillance of contraceptive pills: Secondary | ICD-10-CM

## 2023-10-09 NOTE — Telephone Encounter (Signed)
Med refill request:Patient left message on triage line requesting refill of POP Last AEX: 04/27/22 -JJ Next AEX: 11/23/23 -EB Last MMG (if hormonal med) none on file Refill authorized: Please Advise?

## 2023-10-10 ENCOUNTER — Other Ambulatory Visit: Payer: Self-pay

## 2023-10-10 DIAGNOSIS — Z3041 Encounter for surveillance of contraceptive pills: Secondary | ICD-10-CM

## 2023-10-10 MED ORDER — NORETHINDRONE 0.35 MG PO TABS: 1.0000 | ORAL_TABLET | Freq: Every day | ORAL | 0 refills | Status: DC

## 2023-10-10 NOTE — Telephone Encounter (Signed)
Medication refill request: Micronor  Last AEX:  04/27/22 with jj  Next AEX: 11/23/23 Last MMG (if hormonal medication request): n/a Refill authorized: #84 with 0 rf pended for today

## 2023-11-20 ENCOUNTER — Telehealth: Payer: Self-pay | Admitting: *Deleted

## 2023-11-20 NOTE — Telephone Encounter (Signed)
Call returned to patient. Message was inaudible.   Left message to call [facility] Triage at [phone], option 4.

## 2023-11-23 ENCOUNTER — Encounter: Payer: Self-pay | Admitting: Obstetrics and Gynecology

## 2023-11-23 ENCOUNTER — Other Ambulatory Visit (HOSPITAL_COMMUNITY)
Admission: RE | Admit: 2023-11-23 | Discharge: 2023-11-23 | Disposition: A | Discharge status: Home / Self Care | Payer: Medicaid Other | Source: Ambulatory Visit | Attending: Obstetrics and Gynecology | Admitting: Obstetrics and Gynecology

## 2023-11-23 ENCOUNTER — Ambulatory Visit (INDEPENDENT_AMBULATORY_CARE_PROVIDER_SITE_OTHER): Payer: Medicaid Other | Admitting: Obstetrics and Gynecology

## 2023-11-23 VITALS — BP 112/80 | Ht 63.25 in | Wt 159.0 lb

## 2023-11-23 DIAGNOSIS — Z1231 Encounter for screening mammogram for malignant neoplasm of breast: Secondary | ICD-10-CM

## 2023-11-23 DIAGNOSIS — Z01419 Encounter for gynecological examination (general) (routine) without abnormal findings: Secondary | ICD-10-CM | POA: Diagnosis not present

## 2023-11-23 DIAGNOSIS — D229 Melanocytic nevi, unspecified: Secondary | ICD-10-CM | POA: Diagnosis not present

## 2023-11-23 DIAGNOSIS — E041 Nontoxic single thyroid nodule: Secondary | ICD-10-CM | POA: Diagnosis not present

## 2023-11-23 DIAGNOSIS — Z3041 Encounter for surveillance of contraceptive pills: Secondary | ICD-10-CM

## 2023-11-23 MED ORDER — NORETHINDRONE 0.35 MG PO TABS: 1.0000 | ORAL_TABLET | Freq: Every day | ORAL | 6 refills | Status: DC

## 2023-11-23 NOTE — Progress Notes (Signed)
42 y.o. y.o. female here for annual exam. No LMP recorded. (Menstrual status: Oral contraceptives).     No LMP recorded. (Menstrual status: Oral contraceptives).          Sexually active: Yes.    The current method of family planning is oral progesterone-only contraceptive.   Micronor: has minimal spotting occasionally on this and is pleased with it. Exercising: No.  The patient does not participate in regular exercise at present. 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  Counseled on importance BMD:   none  Colonoscopy: 2021- 2 polyps- Dr. Roxanna Mew. Cousin with h/o stage 4 colon cancer TDaP:  03/01/18  Gardasil: Complete  Thyroid nodule: f/u US ordered Atypical moles: derm referral placed  Body mass index is 27.94 kg/m.      No data to display          Blood pressure 112/80, height 5' 3.25" (1.607 m), weight 159 lb (72.1 kg).     Component Value Date/Time   DIAGPAP  04/27/2022 1408    - Negative for intraepithelial lesion or malignancy (NILM)   DIAGPAP  11/20/2020 1100    - Negative for intraepithelial lesion or malignancy (NILM)   DIAGPAP  06/27/2019 0000    NEGATIVE FOR INTRAEPITHELIAL LESIONS OR MALIGNANCY.   HPVHIGH Positive (A) 04/27/2022 1408   HPVHIGH Negative 11/20/2020 1100   ADEQPAP  04/27/2022 1408    Satisfactory for evaluation; transformation zone component PRESENT.   ADEQPAP  11/20/2020 1100    Satisfactory for evaluation; transformation zone component PRESENT.   ADEQPAP  06/27/2019 0000    Satisfactory for evaluation  endocervical/transformation zone component PRESENT.    GYN HISTORY:    Component Value Date/Time   DIAGPAP  04/27/2022 1408    - Negative for intraepithelial lesion or malignancy (NILM)   DIAGPAP  11/20/2020 1100    - Negative for intraepithelial  lesion or malignancy (NILM)   DIAGPAP  06/27/2019 0000    NEGATIVE FOR INTRAEPITHELIAL LESIONS OR MALIGNANCY.   HPVHIGH Positive (A) 04/27/2022 1408   HPVHIGH Negative 11/20/2020 1100   ADEQPAP  04/27/2022 1408    Satisfactory for evaluation; transformation zone component PRESENT.   ADEQPAP  11/20/2020 1100    Satisfactory for evaluation; transformation zone component PRESENT.   ADEQPAP  06/27/2019 0000    Satisfactory for evaluation  endocervical/transformation zone component PRESENT.    OB History  Gravida Para Term Preterm AB Living  3 1 1   2 1   SAB IAB Ectopic Multiple Live Births  2       1    # Outcome Date GA Lbr Len/2nd Weight Sex Type Anes PTL Lv  3 SAB           2 Term     F CS-Unspec   LIV  1 SAB             Past Medical History:  Diagnosis Date   Anxiety    Dysrhythmia    tachycardia with anxiety   Environmental allergies    GERD (gastroesophageal reflux disease)    Hypertension    Migraine    Shortness of breath dyspnea    with anxiety   Thyroid nodule    benign    Past Surgical History:  Procedure Laterality  Date   BIOPSY THYROID     CERVICAL CONIZATION W/BX N/A 06/02/2016   Procedure: 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  J-plasma rep confirmed on 05/26/16.  Sally notified   CESAREAN SECTION     COLPOSCOPY     MOLE REMOVAL  02/2018   removed from back    WISDOM TOOTH EXTRACTION      Current Outpatient Medications on File Prior to Visit  Medication Sig Dispense Refill   ARIPiprazole (ABILIFY) 5 MG tablet Take 5 mg by mouth daily.     buPROPion (WELLBUTRIN XL) 300 MG 24 hr tablet Take 300 mg by mouth daily.     busPIRone (BUSPAR) 15 MG tablet Take 15 mg by mouth 2 (two) times daily.      cetirizine (ZYRTEC) 10 MG tablet Take 10 mg by mouth daily.     famotidine (PEPCID) 20 MG tablet Take 20  mg by mouth.     ibuprofen (ADVIL) 800 MG tablet Take 1 tablet (800 mg total) by mouth every 8 (eight) hours as needed. 30 tablet 1   LORazepam (ATIVAN) 0.5 MG tablet Take one tablet bid prn     metoprolol (LOPRESSOR) 50 MG tablet Take 50 mg by mouth daily.     Multiple Vitamin (MULTIVITAMIN) tablet Take 1 tablet by mouth.     norethindrone (MICRONOR) 0.35 MG tablet Take 1 tablet (0.35 mg total) by mouth daily. 84 tablet 0   omeprazole (PRILOSEC) 20 MG capsule Take by mouth.     polyethylene glycol powder (GLYCOLAX/MIRALAX) 17 GM/SCOOP powder Take by mouth.     traZODone (DESYREL) 100 MG tablet Take 100-200 mg by mouth at bedtime. sleep     EPINEPHrine 0.3 mg/0.3 mL IJ SOAJ injection Inject into the muscle once. (Patient not taking: Reported on 05/25/2022)     No current facility-administered medications on file prior to visit.    Social History   Socioeconomic History   Marital status: Single    Spouse name: Not on file   Number of children: Not on file   Years of education: Not on file   Highest education level: Not on file  Occupational History   Not on file  Tobacco Use   Smoking status: Never   Smokeless tobacco: Never  Vaping Use   Vaping status: Never Used  Substance and Sexual Activity   Alcohol use: No    Alcohol/week: 0.0 standard drinks of alcohol   Drug use: No   Sexual activity: Yes    Partners: Male    Birth control/protection: Pill  Other Topics Concern   Not on file  Social History Narrative   Not on file   Social Determinants of Health   Financial Resource Strain: Low Risk  (02/20/2023)   Received from [facility]   Overall Financial Resource Strain (CARDIA)    Difficulty of Paying Living Expenses: Not very hard  Food Insecurity: No Food Insecurity (02/20/2023)   [facility], [facility]   Hunger Vital Sign    Worried About Running Out of Food in the Last Year: Never true    Ran Out of Food in the Last Year: Never true   Transportation Needs: No Transportation Needs (02/20/2023)   Received from [facility], [facility]   [facility] - Transportation    Lack of Transportation (Medical): No  Lack of Transportation (Non-Medical): No  Physical Activity: Not on file  Stress: Not on file  Social Connections: Unknown (04/18/2022)   Received from [facility], [facility]   Social Network    Social Network: Not on file  Intimate Partner Violence: Unknown (03/21/2022)   Received from [facility], [facility]   HITS    Physically Hurt: Not on file    Insult or Talk Down To: Not on file    Threaten Physical Harm: Not on file    Scream or Curse: Not on file    Family History  Problem Relation Age of Onset   Hypertension Mother    Atrial fibrillation Mother    Migraines Mother    Migraines Sister    Migraines Brother    Bone cancer Maternal Uncle    Dementia Maternal Grandmother    Leukemia Maternal Grandfather    Migraines Maternal Grandfather    Colon cancer Cousin    OCD Daughter    Other Daughter        pychosis     Allergies  Allergen Reactions   Cantaloupe (Diagnostic) Swelling   Chocolate Swelling   Citrus Swelling   Daucus Carota Swelling   Fish Allergy Swelling   Other Swelling and Rash   Rice Swelling   Tomato Swelling   Vanilla Swelling   Mustard Swelling   Mustard [Allyl Isothiocyanate]     Unknown reaction- just had testing done    Peanut-Containing Drug Products     Unknown reaction- just had test done   Prednisone     Other Reaction(s): Flushing   Shellfish Allergy     Patient states noted on allergy test   Soy Allergy     Unknown reaction -just had testing done       Patient's last menstrual period was No LMP recorded. (Menstrual status: Oral contraceptives)..           Review of Systems Alls systems reviewed and are negative.     Physical Exam Constitutional:      Appearance: Normal appearance.  Genitourinary:     Vulva and urethral meatus normal.      No lesions in the vagina.     Right Labia: No rash, lesions or skin changes.    Left Labia: No lesions, skin changes or rash.    No vaginal discharge or tenderness.     No vaginal prolapse present.    No vaginal atrophy present.     Right Adnexa: not tender, not palpable and no mass present.    Left Adnexa: not tender, not palpable and no mass present.    No cervical motion tenderness or discharge.     Uterus is not enlarged, tender or irregular.  Breasts:    Right: Normal.     Left: Normal.  HENT:     Head: Normocephalic.  Neck:     Thyroid: No thyroid mass, thyromegaly or thyroid tenderness.  Cardiovascular:     Rate and Rhythm: Normal rate and regular rhythm.     Heart sounds: Normal heart sounds, S1 normal and S2 normal.  Pulmonary:     Effort: Pulmonary effort is normal.     Breath sounds: Normal breath sounds and air entry.  Abdominal:     General: There is no distension.     Palpations: Abdomen is soft. There is no mass.     Tenderness: There is no abdominal tenderness. There is no guarding or rebound.  Musculoskeletal:  General: Normal range of motion.     Cervical back: Full passive range of motion without pain, normal range of motion and neck supple. No tenderness.     Right lower leg: No edema.     Left lower leg: No edema.  Neurological:     Mental Status: She is alert.  Skin:    General: Skin is warm.  Psychiatric:        Mood and Affect: Mood normal.        Behavior: Behavior normal.        Thought Content: Thought content normal.  Vitals and nursing note reviewed. Exam conducted with a chaperone present.       A:         Well Woman GYN exam                             P:        Pap smear collected today Encouraged annual mammogram screening Colon cancer screening referral placed today DXA not indicated Labs and immunizations ordered today.  Declines flu shot Encouraged healthy lifestyle practices Encouraged Vit D and Calcium  Referral for  derm and thyroid US placed  No follow-ups on file.  Earley Favor

## 2023-11-24 LAB — LIPID PANEL
Cholesterol: 202 mg/dL — ABNORMAL HIGH (ref <200)
HDL: 62 mg/dL (ref >=50)
LDL Cholesterol (Calc): 122 mg/dL — ABNORMAL HIGH
Non-HDL Cholesterol (Calc): 140 mg/dL — ABNORMAL HIGH (ref <130)
Total CHOL/HDL Ratio: 3.3 (calc) (ref <5.0)
Triglycerides: 81 mg/dL (ref <150)

## 2023-11-24 LAB — CBC
HCT: 43.8 % (ref 35.0–45.0)
Hemoglobin: 14.1 g/dL (ref 11.7–15.5)
MCH: 27.3 pg (ref 27.0–33.0)
MCHC: 32.2 g/dL (ref 32.0–36.0)
MCV: 84.9 fL (ref 80.0–100.0)
MPV: 10.8 fL (ref 7.5–12.5)
Platelets: 283 10*3/uL (ref 140–400)
RBC: 5.16 10*6/uL — ABNORMAL HIGH (ref 3.80–5.10)
RDW: 12.2 % (ref 11.0–15.0)
WBC: 5.8 10*3/uL (ref 3.8–10.8)

## 2023-11-24 LAB — COMPREHENSIVE METABOLIC PANEL
AG Ratio: 2 (calc) (ref 1.0–2.5)
ALT: 16 U/L (ref 6–29)
AST: 15 U/L (ref 10–30)
Albumin: 4.7 g/dL (ref 3.6–5.1)
Alkaline phosphatase (APISO): 60 U/L (ref 31–125)
BUN/Creatinine Ratio: 22 (calc) (ref 6–22)
BUN: 22 mg/dL (ref 7–25)
CO2: 27 mmol/L (ref 20–32)
Calcium: 9.8 mg/dL (ref 8.6–10.2)
Chloride: 104 mmol/L (ref 98–110)
Creat: 1 mg/dL — ABNORMAL HIGH (ref 0.50–0.99)
Globulin: 2.3 g/dL (ref 1.9–3.7)
Glucose, Bld: 106 mg/dL — ABNORMAL HIGH (ref 65–99)
Potassium: 4.5 mmol/L (ref 3.5–5.3)
Sodium: 140 mmol/L (ref 135–146)
Total Bilirubin: 0.7 mg/dL (ref 0.2–1.2)
Total Protein: 7 g/dL (ref 6.1–8.1)

## 2023-11-24 LAB — VITAMIN D 25 HYDROXY (VIT D DEFICIENCY, FRACTURES): Vit D, 25-Hydroxy: 27 ng/mL — ABNORMAL LOW (ref 30–100)

## 2023-11-24 LAB — HEMOGLOBIN A1C
Hgb A1c MFr Bld: 5.9 %{Hb} — ABNORMAL HIGH (ref <5.7)
Mean Plasma Glucose: 123 mg/dL
eAG (mmol/L): 6.8 mmol/L

## 2023-11-24 LAB — TSH: TSH: 1.03 m[IU]/L

## 2023-11-24 NOTE — Telephone Encounter (Signed)
LVMTCB

## 2023-11-27 LAB — CYTOLOGY - PAP
Comment: NEGATIVE
Diagnosis: NEGATIVE
High risk HPV: NEGATIVE

## 2023-11-28 NOTE — Telephone Encounter (Signed)
Spoke with patient to review results. Patient states she figured out what she needed, no f/u needed.   Encounter closed.

## 2024-01-25 ENCOUNTER — Encounter: Payer: Self-pay | Admitting: Obstetrics and Gynecology

## 2024-12-03 ENCOUNTER — Other Ambulatory Visit: Payer: Self-pay

## 2024-12-03 DIAGNOSIS — Z3041 Encounter for surveillance of contraceptive pills: Secondary | ICD-10-CM

## 2024-12-03 MED ORDER — NORETHINDRONE 0.35 MG PO TABS: 1.0000 | ORAL_TABLET | Freq: Every day | ORAL | 6 refills | Status: DC

## 2024-12-03 NOTE — Telephone Encounter (Signed)
 Medication refill request: Micronor   Last AEX:  04/27/22 Next AEX: 12/10/24 Last MMG (if hormonal medication request): 09/09/24 Bi-rads 1 neg  Refill authorized: last refilled 11/23/23 patient is requesting refill before appointment. Please advise.

## 2024-12-10 ENCOUNTER — Ambulatory Visit (INDEPENDENT_AMBULATORY_CARE_PROVIDER_SITE_OTHER): Admitting: Obstetrics and Gynecology

## 2024-12-10 ENCOUNTER — Other Ambulatory Visit (HOSPITAL_COMMUNITY)
Admission: RE | Admit: 2024-12-10 | Discharge: 2024-12-10 | Disposition: A | Discharge status: Home / Self Care | Source: Ambulatory Visit | Attending: Obstetrics and Gynecology | Admitting: Obstetrics and Gynecology

## 2024-12-10 ENCOUNTER — Encounter: Payer: Self-pay | Admitting: Obstetrics and Gynecology

## 2024-12-10 VITALS — BP 112/78 | HR 81 | Ht 62.0 in | Wt 167.0 lb

## 2024-12-10 DIAGNOSIS — Z01419 Encounter for gynecological examination (general) (routine) without abnormal findings: Secondary | ICD-10-CM

## 2024-12-10 DIAGNOSIS — Z1331 Encounter for screening for depression: Secondary | ICD-10-CM | POA: Diagnosis not present

## 2024-12-10 DIAGNOSIS — Z3041 Encounter for surveillance of contraceptive pills: Secondary | ICD-10-CM

## 2024-12-10 MED ORDER — NORETHINDRONE 0.35 MG PO TABS: 1.0000 | ORAL_TABLET | Freq: Every day | ORAL | 6 refills | Status: AC

## 2024-12-10 NOTE — Progress Notes (Signed)
 Pt does not menstruate but reports brown discharge last week.

## 2024-12-10 NOTE — Progress Notes (Signed)
 "   43 y.o. y.o. female here for annual exam. No LMP recorded. (Menstrual status: Oral contraceptives).   No LMP recorded. (Menstrual status: Oral contraceptives).          Sexually active: Yes.    The current method of family planning is oral progesterone-only contraceptive.   Micronor : has minimal spotting occasionally on this and is pleased with it. Exercising: No.  The patient does not participate in regular exercise at present. Smoker:  no Lives 40 minutes away  Health Maintenance: Pap:  see below. 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 Zetta. Cousin with h/o stage 4 colon cancer TDaP:  03/01/18  Gardasil: Complete  Thyroid  nodule: f/u US  ordered 2017 FNA done Atypical moles: derm referral placed Body mass index is 30.54 kg/m.     12/10/2024    3:30 PM  Depression screen PHQ 2/9  Decreased Interest 0  Down, Depressed, Hopeless 1  PHQ - 2 Score 1    Blood pressure 112/78, pulse 81, height 5' 2 (1.575 m), weight 167 lb (75.8 kg), SpO2 99%.     Component Value Date/Time   DIAGPAP  11/23/2023 0937    - Negative for intraepithelial lesion or malignancy (NILM)   DIAGPAP  04/27/2022 1408    - Negative for intraepithelial lesion or malignancy (NILM)   DIAGPAP  11/20/2020 1100    - Negative for intraepithelial lesion or malignancy (NILM)   HPVHIGH Negative 11/23/2023 0937   HPVHIGH Positive (A) 04/27/2022 1408   HPVHIGH Negative 11/20/2020 1100   ADEQPAP  11/23/2023 0937    Satisfactory for evaluation; transformation zone component PRESENT.   ADEQPAP  04/27/2022 1408    Satisfactory for evaluation; transformation zone component PRESENT.   ADEQPAP  11/20/2020 1100    Satisfactory for evaluation; transformation zone component PRESENT.    GYN HISTORY:    Component Value Date/Time   DIAGPAP  11/23/2023 0937    - Negative for  intraepithelial lesion or malignancy (NILM)   DIAGPAP  04/27/2022 1408    - Negative for intraepithelial lesion or malignancy (NILM)   DIAGPAP  11/20/2020 1100    - Negative for intraepithelial lesion or malignancy (NILM)   HPVHIGH Negative 11/23/2023 0937   HPVHIGH Positive (A) 04/27/2022 1408   HPVHIGH Negative 11/20/2020 1100   ADEQPAP  11/23/2023 0937    Satisfactory for evaluation; transformation zone component PRESENT.   ADEQPAP  04/27/2022 1408    Satisfactory for evaluation; transformation zone component PRESENT.   ADEQPAP  11/20/2020 1100    Satisfactory for evaluation; transformation zone component PRESENT.    OB History  Gravida Para Term Preterm AB Living  3 1 1  2 1   SAB IAB Ectopic Multiple Live Births  2    1    # Outcome Date GA Lbr Len/2nd Weight Sex Type Anes PTL Lv  3 SAB           2 Term     F CS-Unspec   LIV  1 SAB             Past Medical History:  Diagnosis Date   Anxiety    Dysrhythmia    tachycardia with anxiety   Environmental allergies    GERD (gastroesophageal reflux disease)    Hypertension    Migraine    Shortness of breath dyspnea  with anxiety   Thyroid  nodule    benign    Past Surgical History:  Procedure Laterality Date   BIOPSY THYROID      CERVICAL CONIZATION W/BX N/A 06/02/2016   Procedure: 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  J-plasma rep confirmed on 05/26/16.  Sally notified   CESAREAN SECTION     COLPOSCOPY     MOLE REMOVAL  02/2018   removed from back    WISDOM TOOTH EXTRACTION      Medications Ordered Prior to Encounter[1]  Social History   Socioeconomic History   Marital status: Single    Spouse name: Not on file   Number of children: Not on file   Years of education: Not on file   Highest education level: Not on file  Occupational History   Not on  file  Tobacco Use   Smoking status: Never   Smokeless tobacco: Never  Vaping Use   Vaping status: Never Used  Substance and Sexual Activity   Alcohol use: No    Alcohol/week: 0.0 standard drinks of alcohol   Drug use: No   Sexual activity: Yes    Partners: Male    Birth control/protection: Pill  Other Topics Concern   Not on file  Social History Narrative   Not on file   Social Drivers of Health   Tobacco Use: Low Risk (12/10/2024)   Patient History    Smoking Tobacco Use: Never    Smokeless Tobacco Use: Never    Passive Exposure: Not on file  Financial Resource Strain: Low Risk (09/02/2024)   Received from [facility]   Overall Financial Resource Strain (CARDIA)    How hard is it for you to pay for the very basics like food, housing, medical care, and heating?: Not very hard  Food Insecurity: Food Insecurity Present (09/02/2024)   Received from [facility]   Epic    Within the past 12 months, you worried that your food would run out before you got the money to buy more.: Sometimes true    Within the past 12 months, the food you bought just didn't last and you didn't have money to get more.: Never true  Transportation Needs: No Transportation Needs (09/02/2024)   Received from [facility]    In the past 12 months, has lack of transportation kept you from medical appointments or from getting medications?: No    In the past 12 months, has lack of transportation kept you from meetings, work, or from getting things needed for daily living?: No  Physical Activity: Insufficiently Active (09/02/2024)   Received from [facility]   Exercise Vital Sign    On average, how many days per week do you engage in moderate to strenuous exercise (like a brisk walk)?: 1 day    On average, how many minutes do you engage in exercise at this level?: 30 min  Stress: Stress Concern Present (09/02/2024)   Received from [facility] -  Occupational Stress Questionnaire    Do you feel stress - tense, restless, nervous, or anxious, or unable to sleep at night because your mind is troubled all the time - these days?: Very much  Social Connections: Somewhat Isolated (09/02/2024)   Received from [facility]   Social Network    How would  you rate your social network (family, work, friends)?: Restricted participation with some degree of social isolation  Intimate Partner Violence: Not At Risk (09/02/2024)   Received from [facility]   HITS    Over the last 12 months how often did your partner scream or curse at you?: Rarely    Over the last 12 months how often did your partner threaten you with physical harm?: Never    Over the last 12 months how often did your partner insult you or talk down to you?: Sometimes    Over the last 12 months how often did your partner physically hurt you?: Sometimes  Depression (PHQ2-9): Low Risk (12/10/2024)   Depression (PHQ2-9)    PHQ-2 Score: 1  Alcohol Screen: Not on file  Housing: High Risk (09/02/2024)   Received from [facility]    In the last 12 months, was there a time when you were not able to pay the mortgage or rent on time?: Yes    In the past 12 months, how many times have you moved where you were living?: 0    At any time in the past 12 months, were you homeless or living in a shelter (including now)?: No  Utilities: Not At Risk (09/02/2024)   Received from [facility]    In the past 12 months has the electric, gas, oil, or water company threatened to shut off services in your home?: No  Health Literacy: Not on file    Family History  Problem Relation Age of Onset   Hypertension Mother    Atrial fibrillation Mother    Migraines Mother    Migraines Sister    Migraines Brother    Bone cancer Maternal Uncle    Dementia Maternal Grandmother    Leukemia Maternal Grandfather    Migraines Maternal Grandfather    Colon cancer Cousin    OCD Daughter    Other  Daughter        pychosis     Allergies[2]    Patient's last menstrual period was No LMP recorded. (Menstrual status: Oral contraceptives)..            Review of Systems Alls systems reviewed and are negative.     Physical Exam Constitutional:      Appearance: Normal appearance.  Genitourinary:     Vulva and urethral meatus normal.     No lesions in the vagina.     Right Labia: No rash, lesions or skin changes.    Left Labia: No lesions, skin changes or rash.    No vaginal discharge or tenderness.     No vaginal prolapse present.    No vaginal atrophy present.     Right Adnexa: not tender, not palpable and no mass present.    Left Adnexa: not tender, not palpable and no mass present.    No cervical motion tenderness or discharge.     Uterus is not enlarged, tender or irregular.  Breasts:    Right: Normal.     Left: Normal.  HENT:     Head: Normocephalic.   Neck:     Thyroid : No thyroid  mass, thyromegaly or thyroid  tenderness.  Cardiovascular:     Rate and Rhythm: Normal rate and regular rhythm.     Heart sounds: Normal heart sounds, S1 normal and S2 normal.  Pulmonary:     Effort: Pulmonary effort is normal.     Breath sounds: Normal breath sounds and air entry.  Abdominal:     General: There is no distension.     Palpations: Abdomen is soft. There is no mass.     Tenderness: There is no abdominal tenderness. There is no guarding or rebound.  Musculoskeletal:        General: Normal range of motion.     Cervical back: Full passive range of motion without pain, normal range of motion and neck supple. No tenderness.     Right lower leg: No edema.     Left lower leg: No edema.  Neurological:     Mental Status: She is alert.  Skin:    General: Skin is warm.  Psychiatric:        Mood and Affect: Mood normal.        Behavior: Behavior normal.        Thought Content: Thought content normal.  Vitals and nursing note reviewed. Exam conducted with a chaperone  present.       A:         Well Woman GYN exam                             P:        Pap smear collected today Encouraged annual mammogram screening Colon cancer screening up-to-date DXA not indicated Labs and immunizations to do with PMD Discussed breast self exams Encouraged healthy lifestyle practices Constipation: counseled on increased water and fiber in her diet. Begin daily colace  No follow-ups on file.  Hannah Hodge     [1]  Current Outpatient Medications on File Prior to Visit  Medication Sig Dispense Refill   ARIPiprazole (ABILIFY) 5 MG tablet Take 5 mg by mouth daily.     buPROPion (WELLBUTRIN XL) 300 MG 24 hr tablet Take 300 mg by mouth daily.     busPIRone (BUSPAR) 15 MG tablet Take 15 mg by mouth 2 (two) times daily.      cetirizine (ZYRTEC) 10 MG tablet Take 10 mg by mouth daily.     EPINEPHrine  0.3 mg/0.3 mL IJ SOAJ injection Inject into the muscle once. (Patient not taking: Reported on 05/25/2022)     famotidine (PEPCID) 20 MG tablet Take 20 mg by mouth.     ibuprofen  (ADVIL ) 800 MG tablet Take 1 tablet (800 mg total) by mouth every 8 (eight) hours as needed. 30 tablet 1   LORazepam (ATIVAN) 0.5 MG tablet Take one tablet bid prn     metoprolol (LOPRESSOR) 50 MG tablet Take 50 mg by mouth daily.     Multiple Vitamin (MULTIVITAMIN) tablet Take 1 tablet by mouth.     norethindrone  (MICRONOR ) 0.35 MG tablet Take 1 tablet (0.35 mg total) by mouth daily. 84 tablet 6   omeprazole (PRILOSEC) 20 MG capsule Take by mouth.     polyethylene glycol powder (GLYCOLAX/MIRALAX) 17 GM/SCOOP powder Take by mouth.     traZODone (DESYREL) 100 MG tablet Take 100-200 mg by mouth at bedtime. sleep     No current facility-administered medications on file prior to visit.  [2]  Allergies Allergen Reactions   Cantaloupe (Diagnostic) Swelling   Chocolate Swelling   Citrus Swelling   Daucus Carota Swelling   Fish Allergy Swelling   Other Swelling and Rash   Rice Swelling    Tomato Swelling   Vanilla Swelling   Mustard Swelling   Mustard [Allyl Isothiocyanate]     Unknown reaction- just had testing done    Peanut-Containing  Drug Products     Unknown reaction- just had test done   Prednisone     Other Reaction(s): Flushing   Shellfish Allergy     Patient states noted on allergy test   Soy Allergy (Obsolete)     Unknown reaction -just had testing done    "

## 2024-12-17 ENCOUNTER — Ambulatory Visit: Payer: Self-pay | Admitting: Radiology

## 2024-12-17 LAB — CYTOLOGY - PAP: Diagnosis: NEGATIVE

## 2025-12-16 ENCOUNTER — Ambulatory Visit: Admitting: Obstetrics and Gynecology
